# Patient Record
Sex: Female | Born: 1955 | Race: White | Hispanic: No
Health system: Southern US, Community
[De-identification: ages and names within clinical notes are randomized; demographics above are authoritative.]

---

## 2017-02-21 ENCOUNTER — Inpatient Hospital Stay
Admission: AD | Admit: 2017-02-21 | Discharge: 2017-03-31 | Disposition: A | Discharge status: Skilled Nursing Facility | Payer: Self-pay | Source: Other Acute Inpatient Hospital | Attending: Nurse Practitioner | Admitting: Nurse Practitioner

## 2017-02-21 DIAGNOSIS — R509 Fever, unspecified: Secondary | ICD-10-CM

## 2017-02-21 DIAGNOSIS — Z4901 Encounter for fitting and adjustment of extracorporeal dialysis catheter: Secondary | ICD-10-CM

## 2017-02-21 DIAGNOSIS — T17908A Unspecified foreign body in respiratory tract, part unspecified causing other injury, initial encounter: Secondary | ICD-10-CM

## 2017-02-21 DIAGNOSIS — Z4659 Encounter for fitting and adjustment of other gastrointestinal appliance and device: Secondary | ICD-10-CM

## 2017-02-21 DIAGNOSIS — T85598A Other mechanical complication of other gastrointestinal prosthetic devices, implants and grafts, initial encounter: Secondary | ICD-10-CM

## 2017-02-21 DIAGNOSIS — Z992 Dependence on renal dialysis: Secondary | ICD-10-CM

## 2017-02-21 DIAGNOSIS — J9690 Respiratory failure, unspecified, unspecified whether with hypoxia or hypercapnia: Secondary | ICD-10-CM

## 2017-02-21 DIAGNOSIS — Z452 Encounter for adjustment and management of vascular access device: Secondary | ICD-10-CM

## 2017-02-22 ENCOUNTER — Other Ambulatory Visit (HOSPITAL_COMMUNITY): Payer: Self-pay

## 2017-02-22 LAB — URINALYSIS, ROUTINE W REFLEX MICROSCOPIC
Bilirubin Urine: NEGATIVE
Glucose, UA: NEGATIVE mg/dL
Hgb urine dipstick: NEGATIVE
Ketones, ur: NEGATIVE mg/dL
Leukocytes, UA: NEGATIVE
NITRITE: NEGATIVE
PH: 6 (ref 5.0–8.0)
Protein, ur: NEGATIVE mg/dL
SPECIFIC GRAVITY, URINE: 1.013 (ref 1.005–1.030)

## 2017-02-22 LAB — CBC WITH DIFFERENTIAL/PLATELET
BASOS ABS: 0 10*3/uL (ref 0.0–0.1)
Basophils Relative: 0 %
EOS PCT: 4 %
Eosinophils Absolute: 0.5 10*3/uL (ref 0.0–0.7)
HEMATOCRIT: 30.9 % — AB (ref 36.0–46.0)
Hemoglobin: 9.2 g/dL — ABNORMAL LOW (ref 12.0–15.0)
LYMPHS ABS: 3.3 10*3/uL (ref 0.7–4.0)
Lymphocytes Relative: 25 %
MCH: 26.7 pg (ref 26.0–34.0)
MCHC: 29.8 g/dL — ABNORMAL LOW (ref 30.0–36.0)
MCV: 89.8 fL (ref 78.0–100.0)
MONO ABS: 1.2 10*3/uL — AB (ref 0.1–1.0)
Monocytes Relative: 9 %
NEUTROS ABS: 8.1 10*3/uL — AB (ref 1.7–7.7)
Neutrophils Relative %: 62 %
PLATELETS: 357 10*3/uL (ref 150–400)
RBC: 3.44 MIL/uL — AB (ref 3.87–5.11)
RDW: 16.2 % — ABNORMAL HIGH (ref 11.5–15.5)
WBC: 13.2 10*3/uL — AB (ref 4.0–10.5)

## 2017-02-22 LAB — COMPREHENSIVE METABOLIC PANEL
ALBUMIN: 2.2 g/dL — AB (ref 3.5–5.0)
ALT: 15 U/L (ref 14–54)
ANION GAP: 13 (ref 5–15)
AST: 19 U/L (ref 15–41)
Alkaline Phosphatase: 107 U/L (ref 38–126)
BUN: 20 mg/dL (ref 6–20)
CHLORIDE: 96 mmol/L — AB (ref 101–111)
CO2: 29 mmol/L (ref 22–32)
Calcium: 8.6 mg/dL — ABNORMAL LOW (ref 8.9–10.3)
Creatinine, Ser: 1.14 mg/dL — ABNORMAL HIGH (ref 0.44–1.00)
GFR calc Af Amer: 59 mL/min — ABNORMAL LOW (ref >60)
GFR calc non Af Amer: 51 mL/min — ABNORMAL LOW (ref >60)
GLUCOSE: 98 mg/dL (ref 65–99)
POTASSIUM: 4.4 mmol/L (ref 3.5–5.1)
Sodium: 138 mmol/L (ref 135–145)
TOTAL PROTEIN: 6.2 g/dL — AB (ref 6.5–8.1)
Total Bilirubin: 0.9 mg/dL (ref 0.3–1.2)

## 2017-02-22 LAB — C DIFFICILE QUICK SCREEN W PCR REFLEX
C DIFFICILE (CDIFF) INTERP: NOT DETECTED
C DIFFICILE (CDIFF) TOXIN: NEGATIVE
C DIFFICLE (CDIFF) ANTIGEN: NEGATIVE

## 2017-02-22 LAB — PROTIME-INR
INR: 1.27
Prothrombin Time: 15.8 seconds — ABNORMAL HIGH (ref 11.4–15.2)

## 2017-02-22 LAB — TSH: TSH: 0.055 u[IU]/mL — AB (ref 0.350–4.500)

## 2017-02-23 LAB — EXPECTORATED SPUTUM ASSESSMENT W GRAM STAIN, RFLX TO RESP C

## 2017-02-23 LAB — URINE CULTURE: CULTURE: NO GROWTH

## 2017-02-23 LAB — BLOOD CULTURE ID PANEL (REFLEXED)
Acinetobacter baumannii: NOT DETECTED
CANDIDA PARAPSILOSIS: NOT DETECTED
CANDIDA TROPICALIS: NOT DETECTED
Candida albicans: NOT DETECTED
Candida glabrata: NOT DETECTED
Candida krusei: NOT DETECTED
Enterobacter cloacae complex: NOT DETECTED
Enterobacteriaceae species: NOT DETECTED
Enterococcus species: NOT DETECTED
Escherichia coli: NOT DETECTED
HAEMOPHILUS INFLUENZAE: NOT DETECTED
KLEBSIELLA PNEUMONIAE: NOT DETECTED
Klebsiella oxytoca: NOT DETECTED
Listeria monocytogenes: NOT DETECTED
Methicillin resistance: DETECTED — AB
NEISSERIA MENINGITIDIS: NOT DETECTED
Proteus species: NOT DETECTED
Pseudomonas aeruginosa: NOT DETECTED
STREPTOCOCCUS SPECIES: NOT DETECTED
Serratia marcescens: NOT DETECTED
Staphylococcus aureus (BCID): NOT DETECTED
Staphylococcus species: DETECTED — AB
Streptococcus agalactiae: NOT DETECTED
Streptococcus pneumoniae: NOT DETECTED
Streptococcus pyogenes: NOT DETECTED

## 2017-02-23 LAB — T4, FREE: Free T4: 0.99 ng/dL (ref 0.61–1.12)

## 2017-02-25 LAB — BASIC METABOLIC PANEL
Anion gap: 11 (ref 5–15)
BUN: 14 mg/dL (ref 6–20)
CALCIUM: 8.6 mg/dL — AB (ref 8.9–10.3)
CO2: 32 mmol/L (ref 22–32)
CREATININE: 1.05 mg/dL — AB (ref 0.44–1.00)
Chloride: 96 mmol/L — ABNORMAL LOW (ref 101–111)
GFR calc non Af Amer: 57 mL/min — ABNORMAL LOW (ref >60)
Glucose, Bld: 94 mg/dL (ref 65–99)
Potassium: 3.6 mmol/L (ref 3.5–5.1)
Sodium: 139 mmol/L (ref 135–145)

## 2017-02-25 LAB — CBC
HCT: 28.2 % — ABNORMAL LOW (ref 36.0–46.0)
Hemoglobin: 8.6 g/dL — ABNORMAL LOW (ref 12.0–15.0)
MCH: 27.1 pg (ref 26.0–34.0)
MCHC: 30.5 g/dL (ref 30.0–36.0)
MCV: 89 fL (ref 78.0–100.0)
PLATELETS: 367 10*3/uL (ref 150–400)
RBC: 3.17 MIL/uL — ABNORMAL LOW (ref 3.87–5.11)
RDW: 16 % — AB (ref 11.5–15.5)
WBC: 10.4 10*3/uL (ref 4.0–10.5)

## 2017-02-25 LAB — CULTURE, BLOOD (ROUTINE X 2): Special Requests: ADEQUATE

## 2017-02-25 LAB — CULTURE, RESPIRATORY: CULTURE: NORMAL

## 2017-02-25 LAB — MAGNESIUM: Magnesium: 2 mg/dL (ref 1.7–2.4)

## 2017-02-25 LAB — PHOSPHORUS: Phosphorus: 4.6 mg/dL (ref 2.5–4.6)

## 2017-02-25 LAB — CULTURE, RESPIRATORY W GRAM STAIN

## 2017-02-26 MED ORDER — SODIUM CHLORIDE 0.9 % IV SOLN
INTRAVENOUS | Status: DC
Start: ? — End: 2017-02-26

## 2017-02-26 MED ORDER — ONDANSETRON HCL 4 MG/2ML IJ SOLN
4.00 | INTRAMUSCULAR | Status: DC
Start: ? — End: 2017-02-26

## 2017-02-26 MED ORDER — GENERIC EXTERNAL MEDICATION
Status: DC
Start: ? — End: 2017-02-26

## 2017-02-26 MED ORDER — POLYETHYLENE GLYCOL 3350 17 G PO PACK
17.00 | PACK | ORAL | Status: DC
Start: ? — End: 2017-02-26

## 2017-02-26 MED ORDER — HYDRALAZINE HCL 20 MG/ML IJ SOLN
10.00 | INTRAMUSCULAR | Status: DC
Start: ? — End: 2017-02-26

## 2017-02-26 MED ORDER — PANTOPRAZOLE SODIUM 40 MG PO TBEC
40.00 | DELAYED_RELEASE_TABLET | ORAL | Status: DC
Start: 2017-02-22 — End: 2017-02-26

## 2017-02-26 MED ORDER — POTASSIUM CHLORIDE ER 10 MEQ PO TBCR
EXTENDED_RELEASE_TABLET | ORAL | Status: DC
Start: 2017-02-22 — End: 2017-02-26

## 2017-02-26 MED ORDER — MORPHINE SULFATE (PF) 4 MG/ML IV SOLN
2.00 | INTRAVENOUS | Status: DC
Start: ? — End: 2017-02-26

## 2017-02-26 MED ORDER — SENNA-DOCUSATE SODIUM 8.6-50 MG PO TABS
ORAL_TABLET | ORAL | Status: DC
Start: 2017-02-21 — End: 2017-02-26

## 2017-02-26 MED ORDER — ASPIRIN 325 MG PO TABS
325.00 | ORAL_TABLET | ORAL | Status: DC
Start: 2017-02-22 — End: 2017-02-26

## 2017-02-26 MED ORDER — GABAPENTIN 300 MG PO CAPS
300.00 | ORAL_CAPSULE | ORAL | Status: DC
Start: 2017-02-21 — End: 2017-02-26

## 2017-02-26 MED ORDER — ONDANSETRON HCL 4 MG PO TABS
4.00 | ORAL_TABLET | ORAL | Status: DC
Start: ? — End: 2017-02-26

## 2017-02-26 MED ORDER — GENERIC EXTERNAL MEDICATION
9.20 | Status: DC
Start: 2017-02-21 — End: 2017-02-26

## 2017-02-26 MED ORDER — LISINOPRIL 10 MG PO TABS
10.00 | ORAL_TABLET | ORAL | Status: DC
Start: 2017-02-22 — End: 2017-02-26

## 2017-02-26 MED ORDER — OXYCODONE HCL 5 MG PO TABS
5.00 | ORAL_TABLET | ORAL | Status: DC
Start: ? — End: 2017-02-26

## 2017-02-26 MED ORDER — BISACODYL 10 MG RE SUPP
10.00 | RECTAL | Status: DC
Start: ? — End: 2017-02-26

## 2017-02-26 MED ORDER — FUROSEMIDE 40 MG PO TABS
40.00 | ORAL_TABLET | ORAL | Status: DC
Start: 2017-02-21 — End: 2017-02-26

## 2017-02-27 LAB — BASIC METABOLIC PANEL
Anion gap: 12 (ref 5–15)
BUN: 16 mg/dL (ref 6–20)
CO2: 29 mmol/L (ref 22–32)
Calcium: 8.6 mg/dL — ABNORMAL LOW (ref 8.9–10.3)
Chloride: 97 mmol/L — ABNORMAL LOW (ref 101–111)
Creatinine, Ser: 1.02 mg/dL — ABNORMAL HIGH (ref 0.44–1.00)
GFR calc Af Amer: >60 mL/min (ref >60)
GFR calc non Af Amer: 59 mL/min — ABNORMAL LOW (ref >60)
Glucose, Bld: 98 mg/dL (ref 65–99)
Potassium: 3.8 mmol/L (ref 3.5–5.1)
Sodium: 138 mmol/L (ref 135–145)

## 2017-02-27 LAB — MAGNESIUM: MAGNESIUM: 1.9 mg/dL (ref 1.7–2.4)

## 2017-02-27 LAB — CK: Total CK: 197 U/L (ref 38–234)

## 2017-02-27 LAB — CBC
HCT: 28.7 % — ABNORMAL LOW (ref 36.0–46.0)
Hemoglobin: 8.6 g/dL — ABNORMAL LOW (ref 12.0–15.0)
MCH: 26.5 pg (ref 26.0–34.0)
MCHC: 30 g/dL (ref 30.0–36.0)
MCV: 88.6 fL (ref 78.0–100.0)
PLATELETS: 386 10*3/uL (ref 150–400)
RBC: 3.24 MIL/uL — ABNORMAL LOW (ref 3.87–5.11)
RDW: 16 % — AB (ref 11.5–15.5)
WBC: 9.9 10*3/uL (ref 4.0–10.5)

## 2017-02-27 LAB — CULTURE, BLOOD (ROUTINE X 2)
CULTURE: NO GROWTH
Special Requests: ADEQUATE

## 2017-02-27 LAB — PHOSPHORUS: Phosphorus: 3.7 mg/dL (ref 2.5–4.6)

## 2017-03-02 LAB — URINALYSIS, ROUTINE W REFLEX MICROSCOPIC
BILIRUBIN URINE: NEGATIVE
GLUCOSE, UA: NEGATIVE mg/dL
HGB URINE DIPSTICK: NEGATIVE
Ketones, ur: NEGATIVE mg/dL
LEUKOCYTES UA: NEGATIVE
Nitrite: NEGATIVE
PH: 5 (ref 5.0–8.0)
Protein, ur: NEGATIVE mg/dL
Specific Gravity, Urine: 1.009 (ref 1.005–1.030)

## 2017-03-03 LAB — BASIC METABOLIC PANEL
ANION GAP: 9 (ref 5–15)
BUN: 19 mg/dL (ref 6–20)
CHLORIDE: 97 mmol/L — AB (ref 101–111)
CO2: 32 mmol/L (ref 22–32)
Calcium: 8.7 mg/dL — ABNORMAL LOW (ref 8.9–10.3)
Creatinine, Ser: 0.99 mg/dL (ref 0.44–1.00)
GFR calc Af Amer: >60 mL/min (ref >60)
GFR calc non Af Amer: >60 mL/min (ref >60)
GLUCOSE: 107 mg/dL — AB (ref 65–99)
POTASSIUM: 3.8 mmol/L (ref 3.5–5.1)
SODIUM: 138 mmol/L (ref 135–145)

## 2017-03-03 LAB — PHOSPHORUS: Phosphorus: 4.4 mg/dL (ref 2.5–4.6)

## 2017-03-03 LAB — CBC
HCT: 30.2 % — ABNORMAL LOW (ref 36.0–46.0)
HEMOGLOBIN: 9 g/dL — AB (ref 12.0–15.0)
MCH: 26.5 pg (ref 26.0–34.0)
MCHC: 29.8 g/dL — ABNORMAL LOW (ref 30.0–36.0)
MCV: 88.8 fL (ref 78.0–100.0)
Platelets: 431 10*3/uL — ABNORMAL HIGH (ref 150–400)
RBC: 3.4 MIL/uL — ABNORMAL LOW (ref 3.87–5.11)
RDW: 16.1 % — ABNORMAL HIGH (ref 11.5–15.5)
WBC: 11.9 10*3/uL — AB (ref 4.0–10.5)

## 2017-03-03 LAB — MAGNESIUM: MAGNESIUM: 1.8 mg/dL (ref 1.7–2.4)

## 2017-03-05 ENCOUNTER — Other Ambulatory Visit (HOSPITAL_COMMUNITY): Payer: Self-pay

## 2017-03-05 LAB — BASIC METABOLIC PANEL
ANION GAP: 12 (ref 5–15)
BUN: 29 mg/dL — ABNORMAL HIGH (ref 6–20)
CO2: 25 mmol/L (ref 22–32)
Calcium: 8.6 mg/dL — ABNORMAL LOW (ref 8.9–10.3)
Chloride: 102 mmol/L (ref 101–111)
Creatinine, Ser: 1.39 mg/dL — ABNORMAL HIGH (ref 0.44–1.00)
GFR calc Af Amer: 47 mL/min — ABNORMAL LOW (ref >60)
GFR, EST NON AFRICAN AMERICAN: 40 mL/min — AB (ref >60)
Glucose, Bld: 98 mg/dL (ref 65–99)
POTASSIUM: 4.9 mmol/L (ref 3.5–5.1)
Sodium: 139 mmol/L (ref 135–145)

## 2017-03-05 LAB — CBC
HEMATOCRIT: 29.9 % — AB (ref 36.0–46.0)
HEMOGLOBIN: 8.7 g/dL — AB (ref 12.0–15.0)
MCH: 25.9 pg — ABNORMAL LOW (ref 26.0–34.0)
MCHC: 29.1 g/dL — ABNORMAL LOW (ref 30.0–36.0)
MCV: 89 fL (ref 78.0–100.0)
Platelets: 437 10*3/uL — ABNORMAL HIGH (ref 150–400)
RBC: 3.36 MIL/uL — ABNORMAL LOW (ref 3.87–5.11)
RDW: 15.7 % — ABNORMAL HIGH (ref 11.5–15.5)
WBC: 11.9 10*3/uL — AB (ref 4.0–10.5)

## 2017-03-05 MED ORDER — GENERIC EXTERNAL MEDICATION
Status: DC
Start: ? — End: 2017-03-05

## 2017-03-06 LAB — LACTIC ACID, PLASMA: Lactic Acid, Venous: 1.2 mmol/L (ref 0.5–1.9)

## 2017-03-07 ENCOUNTER — Other Ambulatory Visit (HOSPITAL_COMMUNITY): Payer: Self-pay

## 2017-03-07 ENCOUNTER — Encounter (HOSPITAL_COMMUNITY): Payer: Self-pay | Admitting: Interventional Radiology

## 2017-03-07 HISTORY — PX: IR US GUIDE VASC ACCESS RIGHT: IMG2390

## 2017-03-07 HISTORY — PX: IR FLUORO GUIDE CV LINE RIGHT: IMG2283

## 2017-03-07 LAB — BASIC METABOLIC PANEL
Anion gap: 10 (ref 5–15)
BUN: 59 mg/dL — AB (ref 6–20)
CALCIUM: 7.9 mg/dL — AB (ref 8.9–10.3)
CO2: 26 mmol/L (ref 22–32)
CREATININE: 4.54 mg/dL — AB (ref 0.44–1.00)
Chloride: 98 mmol/L — ABNORMAL LOW (ref 101–111)
GFR calc Af Amer: 11 mL/min — ABNORMAL LOW (ref >60)
GFR calc non Af Amer: 10 mL/min — ABNORMAL LOW (ref >60)
GLUCOSE: 102 mg/dL — AB (ref 65–99)
Potassium: 5.9 mmol/L — ABNORMAL HIGH (ref 3.5–5.1)
Sodium: 134 mmol/L — ABNORMAL LOW (ref 135–145)

## 2017-03-07 LAB — BLOOD GAS, ARTERIAL
Acid-Base Excess: 1.8 mmol/L (ref 0.0–2.0)
Bicarbonate: 28.2 mmol/L — ABNORMAL HIGH (ref 20.0–28.0)
O2 CONTENT: 4 L/min
O2 Saturation: 98 %
PCO2 ART: 65.2 mmHg — AB (ref 32.0–48.0)
PO2 ART: 107 mmHg (ref 83.0–108.0)
Patient temperature: 98.6
pH, Arterial: 7.259 — ABNORMAL LOW (ref 7.350–7.450)

## 2017-03-07 LAB — LACTIC ACID, PLASMA
LACTIC ACID, VENOUS: 1.3 mmol/L (ref 0.5–1.9)
Lactic Acid, Venous: 1 mmol/L (ref 0.5–1.9)
Lactic Acid, Venous: 1.2 mmol/L (ref 0.5–1.9)

## 2017-03-07 LAB — CBC
HCT: 26.1 % — ABNORMAL LOW (ref 36.0–46.0)
Hemoglobin: 7.7 g/dL — ABNORMAL LOW (ref 12.0–15.0)
MCH: 26.3 pg (ref 26.0–34.0)
MCHC: 29.5 g/dL — AB (ref 30.0–36.0)
MCV: 89.1 fL (ref 78.0–100.0)
Platelets: 381 10*3/uL (ref 150–400)
RBC: 2.93 MIL/uL — ABNORMAL LOW (ref 3.87–5.11)
RDW: 16.4 % — AB (ref 11.5–15.5)
WBC: 12.1 10*3/uL — ABNORMAL HIGH (ref 4.0–10.5)

## 2017-03-07 LAB — URINE CULTURE
Culture: 40000 — AB
Special Requests: NORMAL

## 2017-03-07 LAB — AMMONIA: Ammonia: 36 umol/L — ABNORMAL HIGH (ref 9–35)

## 2017-03-07 MED ORDER — HEPARIN SODIUM (PORCINE) 1000 UNIT/ML IJ SOLN
INTRAMUSCULAR | Status: AC
  Filled 2017-03-07: qty 1

## 2017-03-07 MED ORDER — LIDOCAINE HCL 1 % IJ SOLN
INTRAMUSCULAR | Status: DC | PRN
  Administered 2017-03-07: 10 mL

## 2017-03-07 MED ORDER — LIDOCAINE HCL 1 % IJ SOLN
INTRAMUSCULAR | Status: AC
  Filled 2017-03-07: qty 20

## 2017-03-07 MED ORDER — GENERIC EXTERNAL MEDICATION
Status: DC
Start: ? — End: 2017-03-07

## 2017-03-07 NOTE — Consult Note (Signed)
[facility] CONSULT NOTE    Date: 03/07/2017                  Patient Name:  Janet Perkins  MRN: [id number]  DOB: 1955-02-19  Age / Sex: 62 y.o., female         PCP: Patient, No Pcp Per                 Service Requesting Consult: Hospitalist                 Reason for Consult: Acute renal failure/hyperkalemia.            History of Present Illness: Patient is a 62 y.o. female with a PMHx of subclinical hyperthyroidism, chronic diarrhea, lymphedema, history of respiratory failure, morbid obesity, who was admitted to [facility] on 02/21/2017 for ongoing treatment of left hip infection and generalized debility.  She sustained a left hip fracture on January 18, 2017 and had ORIF.  During surgery she unfortunately suffered a cardiac arrest and was sent to the ICU.  She had a second surgery on January 22, 2017.  Left hip failed fixation healed and patient had to have hardware removal and I&D of the left hip wound with wound VAC placement.  She was subsequent started on daptomycin and Zosyn.  We are now to see the patient for acute renal failure.  2 days ago her BUN was 29 with a creatinine 1.39.  Today her BUN has risen to 59 with a creatinine of 4.54.  In addition we note hyperkalemia with a serum potassium of 5.9.  Patient unable to offer any history.  Patient has also become oliguric at this time.  Case discussed in depth with hospitalist and we will proceed with a course of renal replacement therapy.  Radiology has been consulted to place temporary dialysis catheter.   Medications: Cefepime 2 g IV daily, aspirin 81 mg daily, daptomycin 1000 mg IV daily, Colace 10 mg nightly, Pepcid 20 mg twice daily, furosemide 40 mg twice daily which has been stopped, gabapentin 300 mg nightly, lisinopril 10 mg daily which has been stopped now, OxyContin 10 mg 3 times daily, potassium chloride 20 mEq daily, pro-stat 30 cc twice daily, senna 2 tablets twice daily.  Allergies: Allergies not on  file    Past Medical History: Morbid obesity Obstructive sleep apnea Hypertension Lymphedema Recent left hip fracture status post ORIF with subsequent hardware removal Acute respiratory failure   Past Surgical History: Left hip fracture status post ORIF with subsequent hardware removal  Family History: Unable to obtain from the patient currently as she has altered mental status.  Social History: Unable to obtain from the patient as she currently has altered mental status.  Review of Systems: Unable to obtain from the patient as she currently has altered mental status.  Vital Signs: Temperature 100.0 pulse 87 respirations 34 blood pressure 85/66  Weight trends: There were no vitals filed for this visit.  Physical Exam: General: Critically illa ppearing  Head: Normocephalic, atraumatic.  Eyes: Anicteric, EOMI  Nose: Mucous membranes dry, not inflammed, nonerythematous.  Throat: Oropharynx nonerythematous, no exudate appreciated. OM druy  Neck: Supple, trachea midline.  Lungs:  Normal respiratory effort. Clear to auscultation BL without crackles or wheezes.  Heart: RRR. S1 and S2  No rubs  Abdomen:  BS normoactive. Soft, Nondistended, non-tender.  No masses or organomegaly.  Extremities: Contracted skin b/l LE's  Neurologic: Difficult to arouse, not following commands  Skin: Skin contracted  b/l LE's    Lab results: Basic Metabolic Panel: Recent Labs  Lab 03/03/17 0850 03/05/17 0548 03/07/17 0556  NA 138 139 134*  K 3.8 4.9 5.9*  CL 97* 102 98*  CO2 32 25 26  GLUCOSE 107* 98 102*  BUN 19 29* 59*  CREATININE 0.99 1.39* 4.54*  CALCIUM 8.7* 8.6* 7.9*  MG 1.8  --   --   PHOS 4.4  --   --     Liver Function Tests: No results for input(s): AST, ALT, ALKPHOS, BILITOT, PROT, ALBUMIN in the last 168 hours. No results for input(s): LIPASE, AMYLASE in the last 168 hours. No results for input(s): AMMONIA in the last 168 hours.  CBC: Recent Labs  Lab  03/03/17 0850 03/05/17 0841 03/07/17 0556  WBC 11.9* 11.9* 12.1*  HGB 9.0* 8.7* 7.7*  HCT 30.2* 29.9* 26.1*  MCV 88.8 89.0 89.1  PLT 431* 437* 381    Cardiac Enzymes: No results for input(s): CKTOTAL, CKMB, CKMBINDEX, TROPONINI in the last 168 hours.  BNP: Invalid input(s): POCBNP  CBG: No results for input(s): GLUCAP in the last 168 hours.  Microbiology: Results for orders placed or performed during the hospital encounter of 02/21/17  Culture, blood (routine x 2)     Status: Abnormal   Collection Time: 02/22/17 10:05 AM  Result Value Ref Range Status   Specimen Description BLOOD RIGHT HAND  Final   Special Requests IN PEDIATRIC BOTTLE Blood Culture adequate volume  Final   Culture  Setup Time   Final    GRAM POSITIVE COCCI IN CLUSTERS IN PEDIATRIC BOTTLE CRITICAL RESULT CALLED TO, READ BACK BY AND VERIFIED WITH: PERRY RN AT 1430 ON [id number] BY [facility]    Culture (A)  Final    STAPHYLOCOCCUS SPECIES (COAGULASE NEGATIVE) THE SIGNIFICANCE OF ISOLATING THIS ORGANISM FROM A SINGLE SET OF BLOOD CULTURES WHEN MULTIPLE SETS ARE DRAWN IS UNCERTAIN. PLEASE NOTIFY THE MICROBIOLOGY DEPARTMENT WITHIN ONE WEEK IF SPECIATION AND SENSITIVITIES ARE REQUIRED. Performed at [facility], [address], [place], [place] [address]    Report Status 02/25/2017 FINAL  Final  Blood Culture ID Panel (Reflexed)     Status: Abnormal   Collection Time: 02/22/17 10:05 AM  Result Value Ref Range Status   Enterococcus species NOT DETECTED NOT DETECTED Final   Listeria monocytogenes NOT DETECTED NOT DETECTED Final   Staphylococcus species DETECTED (A) NOT DETECTED Final    Comment: Methicillin (oxacillin) resistant coagulase negative staphylococcus. Possible blood culture contaminant (unless isolated from more than one blood culture draw or clinical case suggests pathogenicity). No antibiotic treatment is indicated for blood  culture contaminants. CRITICAL RESULT CALLED TO, READ BACK BY  AND VERIFIED WITH: LAUREN BAJBUS PHRAMD AT 1416 ON [id number] BY [facility]    Staphylococcus aureus NOT DETECTED NOT DETECTED Final   Methicillin resistance DETECTED (A) NOT DETECTED Final    Comment: CRITICAL RESULT CALLED TO, READ BACK BY AND VERIFIED WITH: LAUREN BAJBUS PHARMD AT 1618 ON [id number] BY [facility]    Streptococcus species NOT DETECTED NOT DETECTED Final   Streptococcus agalactiae NOT DETECTED NOT DETECTED Final   Streptococcus pneumoniae NOT DETECTED NOT DETECTED Final   Streptococcus pyogenes NOT DETECTED NOT DETECTED Final   Acinetobacter baumannii NOT DETECTED NOT DETECTED Final   Enterobacteriaceae species NOT DETECTED NOT DETECTED Final   Enterobacter cloacae complex NOT DETECTED NOT DETECTED Final   Escherichia coli NOT DETECTED NOT DETECTED Final   Klebsiella oxytoca NOT DETECTED NOT DETECTED Final  Klebsiella pneumoniae NOT DETECTED NOT DETECTED Final   Proteus species NOT DETECTED NOT DETECTED Final   Serratia marcescens NOT DETECTED NOT DETECTED Final   Haemophilus influenzae NOT DETECTED NOT DETECTED Final   Neisseria meningitidis NOT DETECTED NOT DETECTED Final   Pseudomonas aeruginosa NOT DETECTED NOT DETECTED Final   Candida albicans NOT DETECTED NOT DETECTED Final   Candida glabrata NOT DETECTED NOT DETECTED Final   Candida krusei NOT DETECTED NOT DETECTED Final   Candida parapsilosis NOT DETECTED NOT DETECTED Final   Candida tropicalis NOT DETECTED NOT DETECTED Final    Comment: Performed at [facility], [address], [place], [place] [address]  Culture, blood (routine x 2)     Status: None   Collection Time: 02/22/17 10:12 AM  Result Value Ref Range Status   Specimen Description BLOOD RIGHT ARM  Final   Special Requests IN PEDIATRIC BOTTLE Blood Culture adequate volume  Final   Culture   Final    NO GROWTH 5 DAYS Performed at [facility], [address] [place], [place] [address]    Report Status 02/27/2017 FINAL  Final  C difficile quick scan w  PCR reflex     Status: None   Collection Time: 02/22/17  2:00 PM  Result Value Ref Range Status   C Diff antigen NEGATIVE NEGATIVE Final   C Diff toxin NEGATIVE NEGATIVE Final   C Diff interpretation No C. difficile detected.  Final    Comment: Performed at [facility], [address] [place], [place] [address]  Culture, Urine     Status: None   Collection Time: 02/22/17  5:44 PM  Result Value Ref Range Status   Specimen Description URINE, RANDOM  Final   Special Requests NONE  Final   Culture   Final    NO GROWTH Performed at [facility], [address] [place], [place] [address]    Report Status 02/23/2017 FINAL  Final  Culture, expectorated sputum-assessment     Status: None   Collection Time: 02/22/17  8:33 PM  Result Value Ref Range Status   Specimen Description SPUTUM  Final   Special Requests NONE  Final   Sputum evaluation   Final    THIS SPECIMEN IS ACCEPTABLE FOR SPUTUM CULTURE Performed at [facility], [address], [place], [place] [address]    Report Status 02/23/2017 FINAL  Final  Culture, respiratory (NON-Expectorated)     Status: None   Collection Time: 02/22/17  8:33 PM  Result Value Ref Range Status   Specimen Description SPUTUM  Final   Special Requests NONE Reflexed from [id number]  Final   Gram Stain   Final    FEW WBC PRESENT, PREDOMINANTLY PMN RARE YEAST FEW GRAM POSITIVE COCCI IN CHAINS FEW SQUAMOUS EPITHELIAL CELLS PRESENT    Culture   Final    Consistent with normal respiratory flora. Performed at [facility], [address] [place], [place] [address]    Report Status 02/25/2017 FINAL  Final  Culture, Urine     Status: Abnormal (Preliminary result)   Collection Time: 03/05/17  3:23 AM  Result Value Ref Range Status   Specimen Description URINE, RANDOM  Final   Special Requests Normal  Final   Culture (A)  Final    40,000 COLONIES/mL PSEUDOMONAS AERUGINOSA SUSCEPTIBILITIES TO FOLLOW Performed at [facility], [address], [place], [place] [address]    Report Status PENDING  Incomplete    Coagulation Studies: No results for input(s): LABPROT, INR in the last 72 hours.  Urinalysis: No results for input(s): COLORURINE, LABSPEC, PHURINE, GLUCOSEU, HGBUR, BILIRUBINUR, KETONESUR, PROTEINUR, UROBILINOGEN, NITRITE, LEUKOCYTESUR in the last 72 hours.  Invalid input(s): APPERANCEUR    Imaging: Dg Chest Port 1 View  Result Date: 03/05/2017 CLINICAL DATA:  Fever. EXAM: PORTABLE CHEST 1 VIEW COMPARISON:  02/22/2017 FINDINGS: The heart size and mediastinal contours are within normal limits. Both lungs are clear. The visualized skeletal structures are unremarkable. IMPRESSION: Normal exam. Electronically Signed   By: Francene BoyersJames  Maxwell M.D.   On: 03/05/2017 17:37      Assessment & Plan: Pt is a 62 y.o. female with a PMHx of subclinical hyperthyroidism, chronic diarrhea, lymphedema, history of respiratory failure, morbid obesity, who was admitted to [facility] on 02/21/2017 for ongoing treatment of left hip infection and generalized debility. She sustained a left hip fracture on January 18, 2017 and had ORIF.  During surgery she unfortunately suffered a cardiac arrest and was sent to the ICU.  She had a second surgery on January 22, 2017.  Left hip failed fixation healed and patient had to have hardware removal and I&D of the left hip wound with wound VAC placement.   1.  Acute renal failure. 2.  Hyperkalemia. 3.  Hypotension. 4.  Hyponatremia.  Plan: The patient has severe acute renal failure now with hyperkalemia.  If she is febrile now and also has hypotension.  She may have re-infection of her left hip.  Recommend further evaluation of this but defer management of this to hospitalist.  In regards to her acute renal failure we have discussed the case with the hospitalist and recommend a course of hemodialysis.  Therefore we will have radiology to place a temporary dialysis catheter.  We will plan for  dialysis today as well as tomorrow.  We also recommend initiation of pressors to maintain map of 65 or greater.  Overall prognosis appears to be quite guarded.

## 2017-03-07 NOTE — Procedures (Signed)
ARF  S/P rt ij temp hd cath  Tip svcra No comp Stable  EBL 0 Ready for use Full report in pacs

## 2017-03-07 NOTE — Progress Notes (Signed)
Patient ID: Janet Perkins, female   DOB: Apr 28, 1955, 62 y.o.   MRN: [id number]   Temporary dialysis catheter placement  Acute renal failure Rising creatinine Pt hx chronic pain and chronic opioids Left hip fx; replacement; infection Hypotension; UTI  Now needs dialysis  Temp cath request   Call into son for consent

## 2017-03-08 LAB — CBC
HEMATOCRIT: 26.2 % — AB (ref 36.0–46.0)
HEMOGLOBIN: 7.8 g/dL — AB (ref 12.0–15.0)
MCH: 25.7 pg — ABNORMAL LOW (ref 26.0–34.0)
MCHC: 29.8 g/dL — ABNORMAL LOW (ref 30.0–36.0)
MCV: 86.5 fL (ref 78.0–100.0)
Platelets: 370 10*3/uL (ref 150–400)
RBC: 3.03 MIL/uL — AB (ref 3.87–5.11)
RDW: 16 % — ABNORMAL HIGH (ref 11.5–15.5)
WBC: 12.2 10*3/uL — AB (ref 4.0–10.5)

## 2017-03-08 LAB — RENAL FUNCTION PANEL
Albumin: 1.8 g/dL — ABNORMAL LOW (ref 3.5–5.0)
Anion gap: 10 (ref 5–15)
BUN: 52 mg/dL — ABNORMAL HIGH (ref 6–20)
CHLORIDE: 101 mmol/L (ref 101–111)
CO2: 25 mmol/L (ref 22–32)
Calcium: 8.1 mg/dL — ABNORMAL LOW (ref 8.9–10.3)
Creatinine, Ser: 2.29 mg/dL — ABNORMAL HIGH (ref 0.44–1.00)
GFR calc non Af Amer: 22 mL/min — ABNORMAL LOW (ref >60)
GFR, EST AFRICAN AMERICAN: 26 mL/min — AB (ref >60)
Glucose, Bld: 92 mg/dL (ref 65–99)
POTASSIUM: 5.7 mmol/L — AB (ref 3.5–5.1)
Phosphorus: 4.3 mg/dL (ref 2.5–4.6)
Sodium: 136 mmol/L (ref 135–145)

## 2017-03-08 LAB — POTASSIUM: Potassium: 5.1 mmol/L (ref 3.5–5.1)

## 2017-03-08 LAB — HEPATITIS B SURFACE ANTIBODY,QUALITATIVE: Hep B S Ab: NONREACTIVE

## 2017-03-08 LAB — HEPATITIS B CORE ANTIBODY, TOTAL: HEP B C TOTAL AB: NEGATIVE

## 2017-03-08 LAB — HEPATITIS B SURFACE ANTIGEN: Hepatitis B Surface Ag: NEGATIVE

## 2017-03-08 LAB — MAGNESIUM: MAGNESIUM: 2.1 mg/dL (ref 1.7–2.4)

## 2017-03-09 ENCOUNTER — Other Ambulatory Visit (HOSPITAL_COMMUNITY): Payer: Self-pay

## 2017-03-09 LAB — BASIC METABOLIC PANEL
ANION GAP: 14 (ref 5–15)
BUN: 26 mg/dL — ABNORMAL HIGH (ref 6–20)
CALCIUM: 7.2 mg/dL — AB (ref 8.9–10.3)
CO2: 20 mmol/L — AB (ref 22–32)
Chloride: 114 mmol/L — ABNORMAL HIGH (ref 101–111)
Creatinine, Ser: 0.84 mg/dL (ref 0.44–1.00)
GLUCOSE: 86 mg/dL (ref 65–99)
POTASSIUM: 4.6 mmol/L (ref 3.5–5.1)
Sodium: 148 mmol/L — ABNORMAL HIGH (ref 135–145)

## 2017-03-09 LAB — CBC
HEMATOCRIT: 26 % — AB (ref 36.0–46.0)
Hemoglobin: 7.9 g/dL — ABNORMAL LOW (ref 12.0–15.0)
MCH: 26 pg (ref 26.0–34.0)
MCHC: 30.4 g/dL (ref 30.0–36.0)
MCV: 85.5 fL (ref 78.0–100.0)
Platelets: 377 10*3/uL (ref 150–400)
RBC: 3.04 MIL/uL — AB (ref 3.87–5.11)
RDW: 16.1 % — ABNORMAL HIGH (ref 11.5–15.5)
WBC: 6.7 10*3/uL (ref 4.0–10.5)

## 2017-03-09 LAB — AMMONIA: AMMONIA: 22 umol/L (ref 9–35)

## 2017-03-10 ENCOUNTER — Other Ambulatory Visit (HOSPITAL_COMMUNITY): Payer: Self-pay

## 2017-03-10 LAB — BASIC METABOLIC PANEL
Anion gap: 9 (ref 5–15)
BUN: 17 mg/dL (ref 6–20)
CHLORIDE: 107 mmol/L (ref 101–111)
CO2: 25 mmol/L (ref 22–32)
CREATININE: 0.87 mg/dL (ref 0.44–1.00)
Calcium: 8.9 mg/dL (ref 8.9–10.3)
GFR calc Af Amer: >60 mL/min (ref >60)
GFR calc non Af Amer: >60 mL/min (ref >60)
Glucose, Bld: 141 mg/dL — ABNORMAL HIGH (ref 65–99)
Potassium: 4.3 mmol/L (ref 3.5–5.1)
Sodium: 141 mmol/L (ref 135–145)

## 2017-03-10 LAB — EXPECTORATED SPUTUM ASSESSMENT W GRAM STAIN, RFLX TO RESP C

## 2017-03-10 LAB — RENAL FUNCTION PANEL
ALBUMIN: 2.3 g/dL — AB (ref 3.5–5.0)
ANION GAP: 10 (ref 5–15)
BUN: 17 mg/dL (ref 6–20)
CALCIUM: 8.8 mg/dL — AB (ref 8.9–10.3)
CO2: 25 mmol/L (ref 22–32)
Chloride: 107 mmol/L (ref 101–111)
Creatinine, Ser: 0.85 mg/dL (ref 0.44–1.00)
GFR calc non Af Amer: >60 mL/min (ref >60)
Glucose, Bld: 127 mg/dL — ABNORMAL HIGH (ref 65–99)
PHOSPHORUS: 2.9 mg/dL (ref 2.5–4.6)
POTASSIUM: 4.4 mmol/L (ref 3.5–5.1)
SODIUM: 142 mmol/L (ref 135–145)

## 2017-03-10 LAB — CBC
HEMATOCRIT: 28.2 % — AB (ref 36.0–46.0)
HEMOGLOBIN: 8.4 g/dL — AB (ref 12.0–15.0)
MCH: 25.7 pg — ABNORMAL LOW (ref 26.0–34.0)
MCHC: 29.8 g/dL — ABNORMAL LOW (ref 30.0–36.0)
MCV: 86.2 fL (ref 78.0–100.0)
Platelets: 459 10*3/uL — ABNORMAL HIGH (ref 150–400)
RBC: 3.27 MIL/uL — AB (ref 3.87–5.11)
RDW: 16.1 % — ABNORMAL HIGH (ref 11.5–15.5)
WBC: 15.8 10*3/uL — ABNORMAL HIGH (ref 4.0–10.5)

## 2017-03-10 LAB — BLOOD GAS, ARTERIAL
ACID-BASE EXCESS: 3.5 mmol/L — AB (ref 0.0–2.0)
BICARBONATE: 28.4 mmol/L — AB (ref 20.0–28.0)
O2 Content: 4 L/min
O2 Saturation: 98.8 %
PCO2 ART: 49.9 mmHg — AB (ref 32.0–48.0)
PH ART: 7.373 (ref 7.350–7.450)
PO2 ART: 127 mmHg — AB (ref 83.0–108.0)
Patient temperature: 98.6

## 2017-03-10 LAB — LACTIC ACID, PLASMA
LACTIC ACID, VENOUS: 1.1 mmol/L (ref 0.5–1.9)
LACTIC ACID, VENOUS: 1.1 mmol/L (ref 0.5–1.9)

## 2017-03-10 LAB — MAGNESIUM: Magnesium: 1.7 mg/dL (ref 1.7–2.4)

## 2017-03-10 NOTE — Progress Notes (Signed)
[facility]  ROUNDING NOTE   Subjective:  Renal function has significantly improved.  Patient did have dialysis on Friday and then on Sunday. She may have aspirated earlier today. Temperature up to 100.8.   Objective:  Vital signs in last 24 hours:  Temperature 100.8 pulse 105 respirations 42 blood pressure 133/80  Physical Exam: General: Critically ill appearing  Head: Normocephalic, atraumatic. Moist oral mucosal membranes  Eyes: Anicteric  Neck: Supple, trachea midline  Lungs:  Bilateral rales and rhonchi, tachypneic  Heart: S1S2 no rubs  Abdomen:  Soft, nontender, bowel sounds present  Extremities: 1+ peripheral edema.  Neurologic: Awake, alert, not consistently following commands  Skin: No lesions  Access: R IJ temporary dialysis catheter.    Basic Metabolic Panel: Recent Labs  Lab 03/07/17 0556 03/08/17 0616 03/08/17 1618 03/09/17 0955 03/10/17 1029 03/10/17 1141  NA 134* 136  --  148* 141 142  K 5.9* 5.7* 5.1 4.6 4.3 4.4  CL 98* 101  --  114* 107 107  CO2 26 25  --  20* 25 25  GLUCOSE 102* 92  --  86 141* 127*  BUN 59* 52*  --  26* 17 17  CREATININE 4.54* 2.29*  --  0.84 0.87 0.85  CALCIUM 7.9* 8.1*  --  7.2* 8.9 8.8*  MG  --  2.1  --   --   --  1.7  PHOS  --  4.3  --   --   --  2.9    Liver Function Tests: Recent Labs  Lab 03/08/17 0616 03/10/17 1141  ALBUMIN 1.8* 2.3*   No results for input(s): LIPASE, AMYLASE in the last 168 hours. Recent Labs  Lab 03/07/17 0949 03/09/17 0955  AMMONIA 36* 22    CBC: Recent Labs  Lab 03/05/17 0841 03/07/17 0556 03/08/17 0940 03/09/17 0955 03/10/17 1141  WBC 11.9* 12.1* 12.2* 6.7 15.8*  HGB 8.7* 7.7* 7.8* 7.9* 8.4*  HCT 29.9* 26.1* 26.2* 26.0* 28.2*  MCV 89.0 89.1 86.5 85.5 86.2  PLT 437* 381 370 377 459*    Cardiac Enzymes: No results for input(s): CKTOTAL, CKMB, CKMBINDEX, TROPONINI in the last 168 hours.  BNP: Invalid input(s): POCBNP  CBG: No results for input(s): GLUCAP in  the last 168 hours.  Microbiology: Results for orders placed or performed during the hospital encounter of 02/21/17  Culture, blood (routine x 2)     Status: Abnormal   Collection Time: 02/22/17 10:05 AM  Result Value Ref Range Status   Specimen Description BLOOD RIGHT HAND  Final   Special Requests IN PEDIATRIC BOTTLE Blood Culture adequate volume  Final   Culture  Setup Time   Final    GRAM POSITIVE COCCI IN CLUSTERS IN PEDIATRIC BOTTLE CRITICAL RESULT CALLED TO, READ BACK BY AND VERIFIED WITH: PERRY RN AT 1430 ON [id number] BY [facility]    Culture (A)  Final    STAPHYLOCOCCUS SPECIES (COAGULASE NEGATIVE) THE SIGNIFICANCE OF ISOLATING THIS ORGANISM FROM A SINGLE SET OF BLOOD CULTURES WHEN MULTIPLE SETS ARE DRAWN IS UNCERTAIN. PLEASE NOTIFY THE MICROBIOLOGY DEPARTMENT WITHIN ONE WEEK IF SPECIATION AND SENSITIVITIES ARE REQUIRED. Performed at [facility], [address] [place], [place] [id number]    Report Status 02/25/2017 FINAL  Final  Blood Culture ID Panel (Reflexed)     Status: Abnormal   Collection Time: 02/22/17 10:05 AM  Result Value Ref Range Status   Enterococcus species NOT DETECTED NOT DETECTED Final   Listeria monocytogenes NOT DETECTED NOT  DETECTED Final   Staphylococcus species DETECTED (A) NOT DETECTED Final    Comment: Methicillin (oxacillin) resistant coagulase negative staphylococcus. Possible blood culture contaminant (unless isolated from more than one blood culture draw or clinical case suggests pathogenicity). No antibiotic treatment is indicated for blood  culture contaminants. CRITICAL RESULT CALLED TO, READ BACK BY AND VERIFIED WITH: LAUREN BAJBUS PHRAMD AT 1416 ON [id number] BY SJW    Staphylococcus aureus NOT DETECTED NOT DETECTED Final   Methicillin resistance DETECTED (A) NOT DETECTED Final    Comment: CRITICAL RESULT CALLED TO, READ BACK BY AND VERIFIED WITH: LAUREN BAJBUS PHARMD AT 1618 ON [id number] BY SJW    Streptococcus species NOT  DETECTED NOT DETECTED Final   Streptococcus agalactiae NOT DETECTED NOT DETECTED Final   Streptococcus pneumoniae NOT DETECTED NOT DETECTED Final   Streptococcus pyogenes NOT DETECTED NOT DETECTED Final   Acinetobacter baumannii NOT DETECTED NOT DETECTED Final   Enterobacteriaceae species NOT DETECTED NOT DETECTED Final   Enterobacter cloacae complex NOT DETECTED NOT DETECTED Final   Escherichia coli NOT DETECTED NOT DETECTED Final   Klebsiella oxytoca NOT DETECTED NOT DETECTED Final   Klebsiella pneumoniae NOT DETECTED NOT DETECTED Final   Proteus species NOT DETECTED NOT DETECTED Final   Serratia marcescens NOT DETECTED NOT DETECTED Final   Haemophilus influenzae NOT DETECTED NOT DETECTED Final   Neisseria meningitidis NOT DETECTED NOT DETECTED Final   Pseudomonas aeruginosa NOT DETECTED NOT DETECTED Final   Candida albicans NOT DETECTED NOT DETECTED Final   Candida glabrata NOT DETECTED NOT DETECTED Final   Candida krusei NOT DETECTED NOT DETECTED Final   Candida parapsilosis NOT DETECTED NOT DETECTED Final   Candida tropicalis NOT DETECTED NOT DETECTED Final    Comment: Performed at [facility], [address] [place], [place] [address]  Culture, blood (routine x 2)     Status: None   Collection Time: 02/22/17 10:12 AM  Result Value Ref Range Status   Specimen Description BLOOD RIGHT ARM  Final   Special Requests IN PEDIATRIC BOTTLE Blood Culture adequate volume  Final   Culture   Final    NO GROWTH 5 DAYS Performed at [facility], [address] [place], [place] [address]    Report Status 02/27/2017 FINAL  Final  C difficile quick scan w PCR reflex     Status: None   Collection Time: 02/22/17  2:00 PM  Result Value Ref Range Status   C Diff antigen NEGATIVE NEGATIVE Final   C Diff toxin NEGATIVE NEGATIVE Final   C Diff interpretation No C. difficile detected.  Final    Comment: Performed at [facility], [address], [place], [place] [address]   Culture, Urine     Status: None   Collection Time: 02/22/17  5:44 PM  Result Value Ref Range Status   Specimen Description URINE, RANDOM  Final   Special Requests NONE  Final   Culture   Final    NO GROWTH Performed at [facility], [address] [place], [place] [address]    Report Status 02/23/2017 FINAL  Final  Culture, expectorated sputum-assessment     Status: None   Collection Time: 02/22/17  8:33 PM  Result Value Ref Range Status   Specimen Description SPUTUM  Final   Special Requests NONE  Final   Sputum evaluation   Final    THIS SPECIMEN IS ACCEPTABLE FOR SPUTUM CULTURE Performed at [facility], [address], [place], [place] [address]    Report Status 02/23/2017 FINAL  Final  Culture, respiratory (NON-Expectorated)     Status: None   Collection Time: 02/22/17  8:33 PM  Result Value Ref Range Status   Specimen Description SPUTUM  Final   Special Requests NONE Reflexed from [id number]  Final   Gram Stain   Final    FEW WBC PRESENT, PREDOMINANTLY PMN RARE YEAST FEW GRAM POSITIVE COCCI IN CHAINS FEW SQUAMOUS EPITHELIAL CELLS PRESENT    Culture   Final    Consistent with normal respiratory flora. Performed at [facility], [address], [place], [place] [address]    Report Status 02/25/2017 FINAL  Final  Culture, Urine     Status: Abnormal   Collection Time: 03/05/17  3:23 AM  Result Value Ref Range Status   Specimen Description URINE, RANDOM  Final   Special Requests   Final    Normal Performed at [facility], [address], [place], [place] [address]    Culture 40,000 COLONIES/mL PSEUDOMONAS AERUGINOSA (A)  Final   Report Status 03/07/2017 FINAL  Final   Organism ID, Bacteria PSEUDOMONAS AERUGINOSA (A)  Final      Susceptibility   Pseudomonas aeruginosa - MIC*    CEFTAZIDIME 4 SENSITIVE Sensitive     CIPROFLOXACIN <=0.25 SENSITIVE Sensitive     GENTAMICIN <=1 SENSITIVE Sensitive     IMIPENEM 1 SENSITIVE Sensitive     PIP/TAZO  8 SENSITIVE Sensitive     CEFEPIME 2 SENSITIVE Sensitive     * 40,000 COLONIES/mL PSEUDOMONAS AERUGINOSA  Culture, blood (routine x 2)     Status: None (Preliminary result)   Collection Time: 03/06/17  7:45 PM  Result Value Ref Range Status   Specimen Description BLOOD RIGHT HAND  Final   Special Requests IN PEDIATRIC BOTTLE Blood Culture adequate volume  Final   Culture   Final    NO GROWTH 3 DAYS Performed at [facility], [address], [place], [place] [address]    Report Status PENDING  Incomplete  Culture, blood (routine x 2)     Status: None (Preliminary result)   Collection Time: 03/06/17  7:50 PM  Result Value Ref Range Status   Specimen Description BLOOD LEFT HAND  Final   Special Requests IN PEDIATRIC BOTTLE Blood Culture adequate volume  Final   Culture   Final    NO GROWTH 3 DAYS Performed at [facility], [address], [place], [place] [address]    Report Status PENDING  Incomplete    Coagulation Studies: No results for input(s): LABPROT, INR in the last 72 hours.  Urinalysis: No results for input(s): COLORURINE, LABSPEC, PHURINE, GLUCOSEU, HGBUR, BILIRUBINUR, KETONESUR, PROTEINUR, UROBILINOGEN, NITRITE, LEUKOCYTESUR in the last 72 hours.  Invalid input(s): APPERANCEUR    Imaging: Dg Chest Port 1 View  Result Date: 03/10/2017 CLINICAL DATA:  Aspiration, nausea and vomiting EXAM: PORTABLE CHEST 1 VIEW COMPARISON:  Chest x-rays dated 03/09/2017 and 02/22/2017 FINDINGS: Stable cardiomegaly. Overall cardiomediastinal silhouette is stable. Right IJ line is stable in position with tip at the level of the mid SVC. Left-sided PICC line is also stable in position with tip at the level of the mid/lower SVC. Lungs are clear. No pleural effusion or pneumothorax seen. Osseous structures about the chest are unremarkable. IMPRESSION: 1. No active disease.  No evidence of pneumonia or pulmonary edema. 2. Stable cardiomegaly. 3. Right IJ line and left-sided PICC line  are stable in  position. Electronically Signed   By: Bary Richard M.D.   On: 03/10/2017 12:34   Dg Chest Port 1 View  Result Date: 03/09/2017 CLINICAL DATA:  PICC placement. EXAM: PORTABLE CHEST 1 VIEW COMPARISON:  03/05/2017 FINDINGS: Left-sided PICC has its tip projecting in the lower superior vena cava just above the caval atrial junction. There is a right internal jugular dual-lumen central venous line with its tip in the lower superior vena cava just above the tip of the PICC. Lungs are clear.  No pneumothorax. Cardiac silhouette is normal in size. No mediastinal or hilar masses. IMPRESSION: 1. Left PICC tip projects in the lower superior vena cava. 2. No acute cardiopulmonary disease. Electronically Signed   By: Amie Portland M.D.   On: 03/09/2017 13:31     Medications:     lidocaine  Assessment/ Plan:  62 y.o. female with a PMHx of subclinical hyperthyroidism, chronic diarrhea, lymphedema, history of respiratory failure, morbid obesity, who was admitted to [facility] on 02/21/2017 for ongoing treatment of left hip infection and generalized debility. She sustained a left hip fracture on January 18, 2017 and had ORIF.  During surgery she unfortunately suffered a cardiac arrest and was sent to the ICU.  She had a second surgery on January 22, 2017.  Left hip failed fixation healed and patient had to have hardware removal and I&D of the left hip wound with wound VAC placement.   1.  Acute renal failure. 2.  Hyperkalemia. 3.  Hypotension. 4.  Hyponatremia.  Plan: Renal function has significantly improved.  BUN down to 17 with a creatinine of 0.85.  Good urine output noted.  Therefore no further indication for dialysis at the moment.  It appears that she is also had some aspiration.  Tachypnea noted at present.  Patient high risk for worsening respiratory function.  High risk for intubation as well.  Case discussed with hospitalist.  We will continue to monitor her progress closely however as  above she does not need any further dialysis at the moment.    LOS: 0 Avriel Kandel 3/4/20194:27 PM

## 2017-03-11 ENCOUNTER — Other Ambulatory Visit (HOSPITAL_COMMUNITY): Payer: Self-pay

## 2017-03-11 LAB — RENAL FUNCTION PANEL
ALBUMIN: 2.4 g/dL — AB (ref 3.5–5.0)
ANION GAP: 11 (ref 5–15)
BUN: 18 mg/dL (ref 6–20)
CHLORIDE: 107 mmol/L (ref 101–111)
CO2: 25 mmol/L (ref 22–32)
Calcium: 8.7 mg/dL — ABNORMAL LOW (ref 8.9–10.3)
Creatinine, Ser: 0.86 mg/dL (ref 0.44–1.00)
GFR calc non Af Amer: >60 mL/min (ref >60)
Glucose, Bld: 110 mg/dL — ABNORMAL HIGH (ref 65–99)
PHOSPHORUS: 4 mg/dL (ref 2.5–4.6)
POTASSIUM: 4.4 mmol/L (ref 3.5–5.1)
Sodium: 143 mmol/L (ref 135–145)

## 2017-03-11 LAB — CBC
HEMATOCRIT: 30 % — AB (ref 36.0–46.0)
HEMOGLOBIN: 8.6 g/dL — AB (ref 12.0–15.0)
MCH: 25.5 pg — ABNORMAL LOW (ref 26.0–34.0)
MCHC: 28.7 g/dL — ABNORMAL LOW (ref 30.0–36.0)
MCV: 89 fL (ref 78.0–100.0)
Platelets: 416 10*3/uL — ABNORMAL HIGH (ref 150–400)
RBC: 3.37 MIL/uL — ABNORMAL LOW (ref 3.87–5.11)
RDW: 16.3 % — ABNORMAL HIGH (ref 11.5–15.5)
WBC: 18 10*3/uL — AB (ref 4.0–10.5)

## 2017-03-11 LAB — MAGNESIUM: Magnesium: 1.8 mg/dL (ref 1.7–2.4)

## 2017-03-12 ENCOUNTER — Other Ambulatory Visit (HOSPITAL_COMMUNITY): Payer: Self-pay

## 2017-03-12 LAB — BASIC METABOLIC PANEL
Anion gap: 7 (ref 5–15)
BUN: 18 mg/dL (ref 6–20)
CHLORIDE: 107 mmol/L (ref 101–111)
CO2: 30 mmol/L (ref 22–32)
CREATININE: 0.79 mg/dL (ref 0.44–1.00)
Calcium: 8.8 mg/dL — ABNORMAL LOW (ref 8.9–10.3)
GFR calc Af Amer: >60 mL/min (ref >60)
GFR calc non Af Amer: >60 mL/min (ref >60)
GLUCOSE: 139 mg/dL — AB (ref 65–99)
POTASSIUM: 4.2 mmol/L (ref 3.5–5.1)
Sodium: 144 mmol/L (ref 135–145)

## 2017-03-12 LAB — CBC
HCT: 29.6 % — ABNORMAL LOW (ref 36.0–46.0)
HEMOGLOBIN: 8.3 g/dL — AB (ref 12.0–15.0)
MCH: 26.3 pg (ref 26.0–34.0)
MCHC: 28 g/dL — AB (ref 30.0–36.0)
MCV: 93.7 fL (ref 78.0–100.0)
Platelets: 429 10*3/uL — ABNORMAL HIGH (ref 150–400)
RBC: 3.16 MIL/uL — AB (ref 3.87–5.11)
RDW: 16.5 % — ABNORMAL HIGH (ref 11.5–15.5)
WBC: 14.4 10*3/uL — ABNORMAL HIGH (ref 4.0–10.5)

## 2017-03-12 LAB — C-REACTIVE PROTEIN: CRP: 18 mg/dL — ABNORMAL HIGH (ref <1.0)

## 2017-03-12 LAB — SEDIMENTATION RATE: SED RATE: 112 mm/h — AB (ref 0–22)

## 2017-03-12 LAB — CULTURE, BLOOD (ROUTINE X 2)
CULTURE: NO GROWTH
Culture: NO GROWTH
Special Requests: ADEQUATE
Special Requests: ADEQUATE

## 2017-03-12 LAB — MAGNESIUM: Magnesium: 1.9 mg/dL (ref 1.7–2.4)

## 2017-03-12 LAB — PHOSPHORUS: Phosphorus: 3.2 mg/dL (ref 2.5–4.6)

## 2017-03-13 ENCOUNTER — Other Ambulatory Visit (HOSPITAL_COMMUNITY): Payer: Self-pay

## 2017-03-13 LAB — BASIC METABOLIC PANEL
Anion gap: 10 (ref 5–15)
BUN: 21 mg/dL — AB (ref 6–20)
CO2: 31 mmol/L (ref 22–32)
CREATININE: 0.85 mg/dL (ref 0.44–1.00)
Calcium: 8.8 mg/dL — ABNORMAL LOW (ref 8.9–10.3)
Chloride: 103 mmol/L (ref 101–111)
GFR calc Af Amer: >60 mL/min (ref >60)
GFR calc non Af Amer: >60 mL/min (ref >60)
Glucose, Bld: 110 mg/dL — ABNORMAL HIGH (ref 65–99)
POTASSIUM: 3.8 mmol/L (ref 3.5–5.1)
Sodium: 144 mmol/L (ref 135–145)

## 2017-03-13 LAB — CBC
HCT: 30 % — ABNORMAL LOW (ref 36.0–46.0)
Hemoglobin: 8.3 g/dL — ABNORMAL LOW (ref 12.0–15.0)
MCH: 25.5 pg — ABNORMAL LOW (ref 26.0–34.0)
MCHC: 27.7 g/dL — ABNORMAL LOW (ref 30.0–36.0)
MCV: 92.3 fL (ref 78.0–100.0)
Platelets: 409 10*3/uL — ABNORMAL HIGH (ref 150–400)
RBC: 3.25 MIL/uL — ABNORMAL LOW (ref 3.87–5.11)
RDW: 16.5 % — ABNORMAL HIGH (ref 11.5–15.5)
WBC: 13.2 10*3/uL — AB (ref 4.0–10.5)

## 2017-03-13 MED ORDER — GENERIC EXTERNAL MEDICATION
Status: DC
Start: ? — End: 2017-03-13

## 2017-03-14 LAB — CULTURE, RESPIRATORY W GRAM STAIN: Culture: NORMAL

## 2017-03-14 LAB — CULTURE, RESPIRATORY

## 2017-03-15 LAB — RENAL FUNCTION PANEL
Albumin: 2.2 g/dL — ABNORMAL LOW (ref 3.5–5.0)
Anion gap: 12 (ref 5–15)
BUN: 15 mg/dL (ref 6–20)
CALCIUM: 8.5 mg/dL — AB (ref 8.9–10.3)
CO2: 34 mmol/L — ABNORMAL HIGH (ref 22–32)
CREATININE: 0.66 mg/dL (ref 0.44–1.00)
Chloride: 97 mmol/L — ABNORMAL LOW (ref 101–111)
Glucose, Bld: 97 mg/dL (ref 65–99)
PHOSPHORUS: 2.1 mg/dL — AB (ref 2.5–4.6)
Potassium: 3 mmol/L — ABNORMAL LOW (ref 3.5–5.1)
SODIUM: 143 mmol/L (ref 135–145)

## 2017-03-15 LAB — CBC
HCT: 27.9 % — ABNORMAL LOW (ref 36.0–46.0)
HEMOGLOBIN: 8.4 g/dL — AB (ref 12.0–15.0)
MCH: 26.5 pg (ref 26.0–34.0)
MCHC: 30.1 g/dL (ref 30.0–36.0)
MCV: 88 fL (ref 78.0–100.0)
PLATELETS: 347 10*3/uL (ref 150–400)
RBC: 3.17 MIL/uL — ABNORMAL LOW (ref 3.87–5.11)
RDW: 16.9 % — ABNORMAL HIGH (ref 11.5–15.5)
WBC: 12 10*3/uL — AB (ref 4.0–10.5)

## 2017-03-15 LAB — MAGNESIUM: MAGNESIUM: 1.5 mg/dL — AB (ref 1.7–2.4)

## 2017-03-16 LAB — RENAL FUNCTION PANEL
Albumin: 2.2 g/dL — ABNORMAL LOW (ref 3.5–5.0)
Anion gap: 10 (ref 5–15)
BUN: 12 mg/dL (ref 6–20)
CHLORIDE: 97 mmol/L — AB (ref 101–111)
CO2: 35 mmol/L — AB (ref 22–32)
CREATININE: 0.66 mg/dL (ref 0.44–1.00)
Calcium: 8.3 mg/dL — ABNORMAL LOW (ref 8.9–10.3)
GFR calc Af Amer: >60 mL/min (ref >60)
GFR calc non Af Amer: >60 mL/min (ref >60)
GLUCOSE: 102 mg/dL — AB (ref 65–99)
Phosphorus: 2.6 mg/dL (ref 2.5–4.6)
Potassium: 3.6 mmol/L (ref 3.5–5.1)
Sodium: 142 mmol/L (ref 135–145)

## 2017-03-16 LAB — CBC
HCT: 26.9 % — ABNORMAL LOW (ref 36.0–46.0)
Hemoglobin: 8.2 g/dL — ABNORMAL LOW (ref 12.0–15.0)
MCH: 26.9 pg (ref 26.0–34.0)
MCHC: 30.5 g/dL (ref 30.0–36.0)
MCV: 88.2 fL (ref 78.0–100.0)
PLATELETS: 323 10*3/uL (ref 150–400)
RBC: 3.05 MIL/uL — ABNORMAL LOW (ref 3.87–5.11)
RDW: 17 % — ABNORMAL HIGH (ref 11.5–15.5)
WBC: 10.5 10*3/uL (ref 4.0–10.5)

## 2017-03-16 LAB — MAGNESIUM: Magnesium: 1.8 mg/dL (ref 1.7–2.4)

## 2017-03-18 LAB — MAGNESIUM: Magnesium: 1.7 mg/dL (ref 1.7–2.4)

## 2017-03-18 LAB — BASIC METABOLIC PANEL
ANION GAP: 8 (ref 5–15)
BUN: 12 mg/dL (ref 6–20)
CALCIUM: 8.4 mg/dL — AB (ref 8.9–10.3)
CO2: 40 mmol/L — ABNORMAL HIGH (ref 22–32)
CREATININE: 0.72 mg/dL (ref 0.44–1.00)
Chloride: 95 mmol/L — ABNORMAL LOW (ref 101–111)
GFR calc Af Amer: >60 mL/min (ref >60)
GFR calc non Af Amer: >60 mL/min (ref >60)
GLUCOSE: 105 mg/dL — AB (ref 65–99)
Potassium: 3.9 mmol/L (ref 3.5–5.1)
Sodium: 143 mmol/L (ref 135–145)

## 2017-03-18 LAB — URINALYSIS, ROUTINE W REFLEX MICROSCOPIC
BILIRUBIN URINE: NEGATIVE
GLUCOSE, UA: NEGATIVE mg/dL
HGB URINE DIPSTICK: NEGATIVE
Ketones, ur: NEGATIVE mg/dL
Leukocytes, UA: NEGATIVE
NITRITE: NEGATIVE
PH: 5 (ref 5.0–8.0)
Protein, ur: NEGATIVE mg/dL
SPECIFIC GRAVITY, URINE: 1.018 (ref 1.005–1.030)

## 2017-03-18 LAB — CBC
HCT: 29.8 % — ABNORMAL LOW (ref 36.0–46.0)
Hemoglobin: 8.4 g/dL — ABNORMAL LOW (ref 12.0–15.0)
MCH: 25.3 pg — ABNORMAL LOW (ref 26.0–34.0)
MCHC: 28.2 g/dL — AB (ref 30.0–36.0)
MCV: 89.8 fL (ref 78.0–100.0)
PLATELETS: 286 10*3/uL (ref 150–400)
RBC: 3.32 MIL/uL — ABNORMAL LOW (ref 3.87–5.11)
RDW: 17.2 % — AB (ref 11.5–15.5)
WBC: 13.1 10*3/uL — AB (ref 4.0–10.5)

## 2017-03-19 LAB — URINE CULTURE: Culture: NO GROWTH

## 2017-03-19 LAB — MAGNESIUM: Magnesium: 2 mg/dL (ref 1.7–2.4)

## 2017-03-20 LAB — BASIC METABOLIC PANEL
Anion gap: 13 (ref 5–15)
Anion gap: 15 (ref 5–15)
BUN: 14 mg/dL (ref 6–20)
BUN: 13 mg/dL (ref 6–20)
CALCIUM: 8.7 mg/dL — AB (ref 8.9–10.3)
CALCIUM: 8.8 mg/dL — AB (ref 8.9–10.3)
CO2: 31 mmol/L (ref 22–32)
CO2: 31 mmol/L (ref 22–32)
CREATININE: 0.63 mg/dL (ref 0.44–1.00)
Chloride: 98 mmol/L — ABNORMAL LOW (ref 101–111)
Chloride: 98 mmol/L — ABNORMAL LOW (ref 101–111)
Creatinine, Ser: 0.69 mg/dL (ref 0.44–1.00)
GFR calc Af Amer: >60 mL/min (ref >60)
GFR calc Af Amer: >60 mL/min (ref >60)
GLUCOSE: 99 mg/dL (ref 65–99)
GLUCOSE: 118 mg/dL — AB (ref 65–99)
Potassium: 6 mmol/L — ABNORMAL HIGH (ref 3.5–5.1)
Potassium: 5.6 mmol/L — ABNORMAL HIGH (ref 3.5–5.1)
Sodium: 142 mmol/L (ref 135–145)
Sodium: 144 mmol/L (ref 135–145)

## 2017-03-21 LAB — POTASSIUM: Potassium: 4.1 mmol/L (ref 3.5–5.1)

## 2017-03-24 LAB — BASIC METABOLIC PANEL
ANION GAP: 9 (ref 5–15)
BUN: 17 mg/dL (ref 6–20)
CALCIUM: 8.9 mg/dL (ref 8.9–10.3)
CHLORIDE: 95 mmol/L — AB (ref 101–111)
CO2: 35 mmol/L — AB (ref 22–32)
Creatinine, Ser: 0.73 mg/dL (ref 0.44–1.00)
GFR calc Af Amer: >60 mL/min (ref >60)
GFR calc non Af Amer: >60 mL/min (ref >60)
GLUCOSE: 110 mg/dL — AB (ref 65–99)
Potassium: 4.6 mmol/L (ref 3.5–5.1)
Sodium: 139 mmol/L (ref 135–145)

## 2017-03-24 LAB — CBC
HEMATOCRIT: 33 % — AB (ref 36.0–46.0)
HEMOGLOBIN: 9.5 g/dL — AB (ref 12.0–15.0)
MCH: 25.5 pg — AB (ref 26.0–34.0)
MCHC: 28.8 g/dL — ABNORMAL LOW (ref 30.0–36.0)
MCV: 88.5 fL (ref 78.0–100.0)
Platelets: 358 10*3/uL (ref 150–400)
RBC: 3.73 MIL/uL — ABNORMAL LOW (ref 3.87–5.11)
RDW: 16.7 % — ABNORMAL HIGH (ref 11.5–15.5)
WBC: 10.6 10*3/uL — AB (ref 4.0–10.5)

## 2017-03-24 LAB — MAGNESIUM: Magnesium: 1.9 mg/dL (ref 1.7–2.4)

## 2017-03-27 LAB — RENAL FUNCTION PANEL
ALBUMIN: 2.3 g/dL — AB (ref 3.5–5.0)
Anion gap: 9 (ref 5–15)
BUN: 25 mg/dL — ABNORMAL HIGH (ref 6–20)
CALCIUM: 8.6 mg/dL — AB (ref 8.9–10.3)
CO2: 34 mmol/L — ABNORMAL HIGH (ref 22–32)
Chloride: 93 mmol/L — ABNORMAL LOW (ref 101–111)
Creatinine, Ser: 0.88 mg/dL (ref 0.44–1.00)
Glucose, Bld: 115 mg/dL — ABNORMAL HIGH (ref 65–99)
PHOSPHORUS: 4.7 mg/dL — AB (ref 2.5–4.6)
POTASSIUM: 4.5 mmol/L (ref 3.5–5.1)
SODIUM: 136 mmol/L (ref 135–145)

## 2017-03-27 LAB — CBC
HEMATOCRIT: 30.8 % — AB (ref 36.0–46.0)
HEMOGLOBIN: 9.3 g/dL — AB (ref 12.0–15.0)
MCH: 26.4 pg (ref 26.0–34.0)
MCHC: 30.2 g/dL (ref 30.0–36.0)
MCV: 87.5 fL (ref 78.0–100.0)
Platelets: 410 10*3/uL — ABNORMAL HIGH (ref 150–400)
RBC: 3.52 MIL/uL — ABNORMAL LOW (ref 3.87–5.11)
RDW: 16.9 % — AB (ref 11.5–15.5)
WBC: 11.9 10*3/uL — AB (ref 4.0–10.5)

## 2017-03-27 LAB — MAGNESIUM: Magnesium: 1.8 mg/dL (ref 1.7–2.4)

## 2017-03-30 LAB — CBC
HEMATOCRIT: 27.6 % — AB (ref 36.0–46.0)
Hemoglobin: 8.1 g/dL — ABNORMAL LOW (ref 12.0–15.0)
MCH: 25.6 pg — AB (ref 26.0–34.0)
MCHC: 29.3 g/dL — AB (ref 30.0–36.0)
MCV: 87.1 fL (ref 78.0–100.0)
PLATELETS: 419 10*3/uL — AB (ref 150–400)
RBC: 3.17 MIL/uL — ABNORMAL LOW (ref 3.87–5.11)
RDW: 16.6 % — AB (ref 11.5–15.5)
WBC: 11.9 10*3/uL — AB (ref 4.0–10.5)

## 2017-03-30 LAB — RENAL FUNCTION PANEL
Albumin: 2.1 g/dL — ABNORMAL LOW (ref 3.5–5.0)
Anion gap: 10 (ref 5–15)
BUN: 17 mg/dL (ref 6–20)
CHLORIDE: 97 mmol/L — AB (ref 101–111)
CO2: 32 mmol/L (ref 22–32)
CREATININE: 0.77 mg/dL (ref 0.44–1.00)
Calcium: 8.6 mg/dL — ABNORMAL LOW (ref 8.9–10.3)
GFR calc Af Amer: >60 mL/min (ref >60)
GLUCOSE: 102 mg/dL — AB (ref 65–99)
POTASSIUM: 4.2 mmol/L (ref 3.5–5.1)
Phosphorus: 4.7 mg/dL — ABNORMAL HIGH (ref 2.5–4.6)
Sodium: 139 mmol/L (ref 135–145)

## 2017-03-30 LAB — MAGNESIUM: Magnesium: 1.9 mg/dL (ref 1.7–2.4)

## 2018-02-07 DEATH — deceased

## 2018-12-13 IMAGING — US US RENAL
1 series · 14 of 24 positions shown · non-contrast
Comparison: None.

CLINICAL DATA: Encounter for fitting and adjustment of dialysis
catheter. Acute renal failure.

EXAM:
RENAL / URINARY TRACT ULTRASOUND COMPLETE

[Series 1: us renal · 0.31mm/px · 14 of 24 slices shown]
[im 1/24]
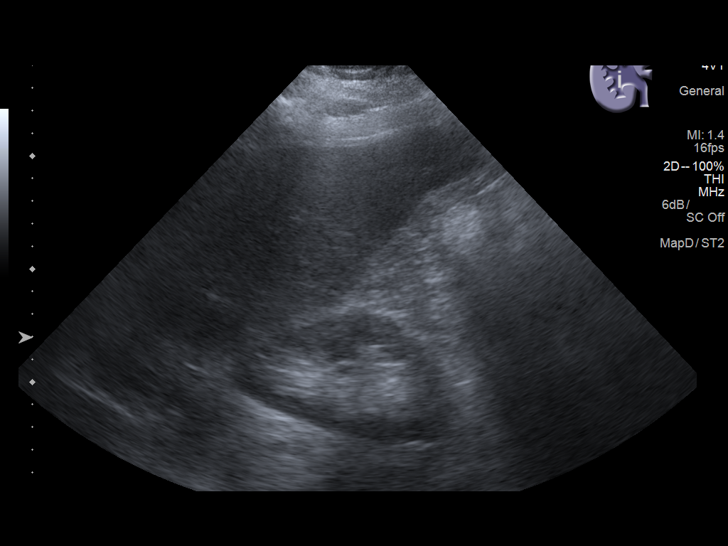
[im 3/24]
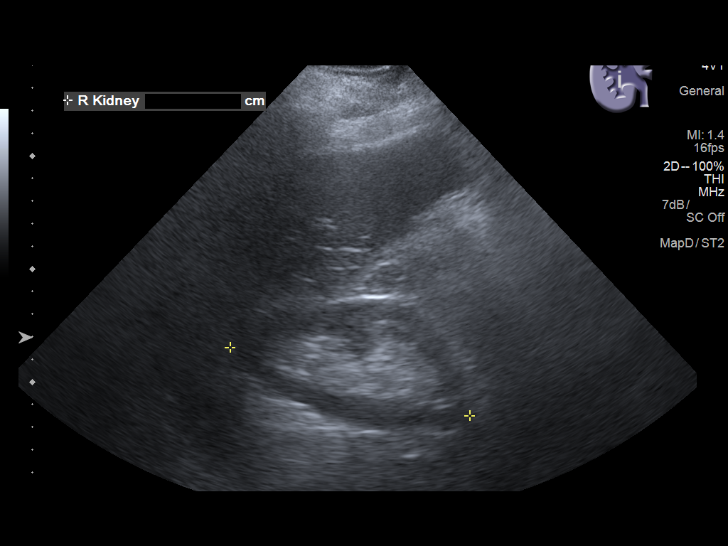
[im 5/24]
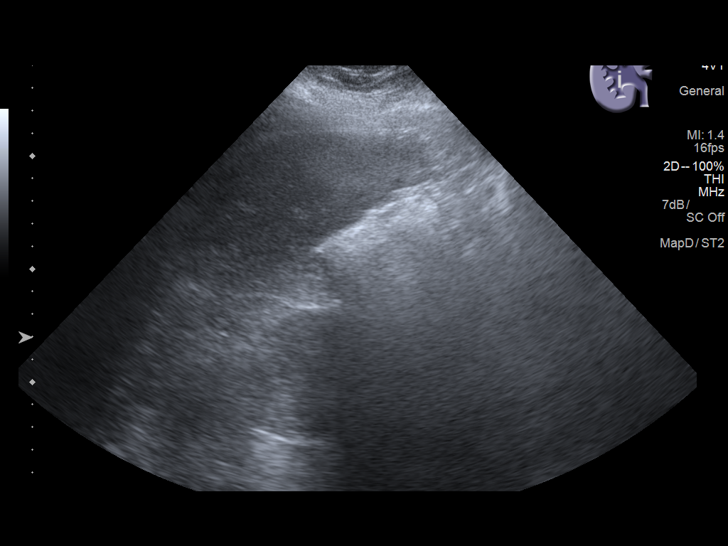
[im 7/24]
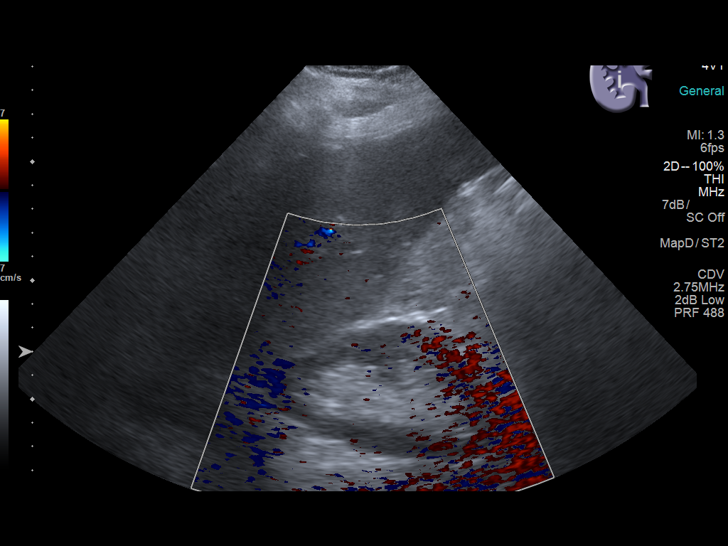
[im 8/24]
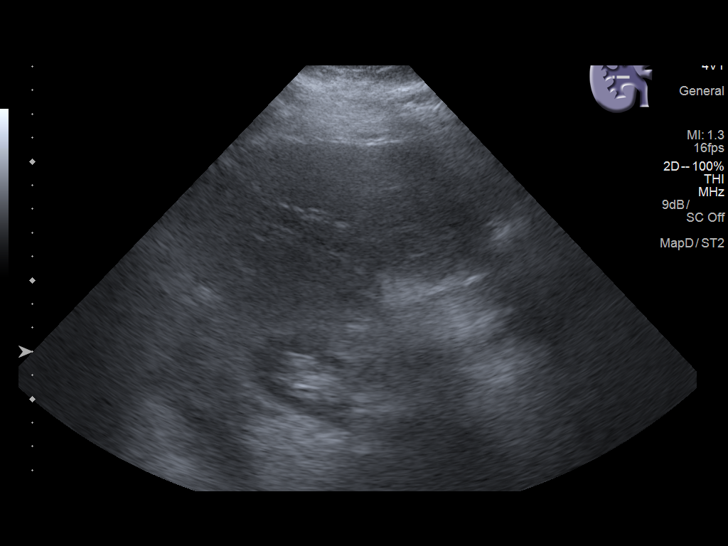
[im 10/24]
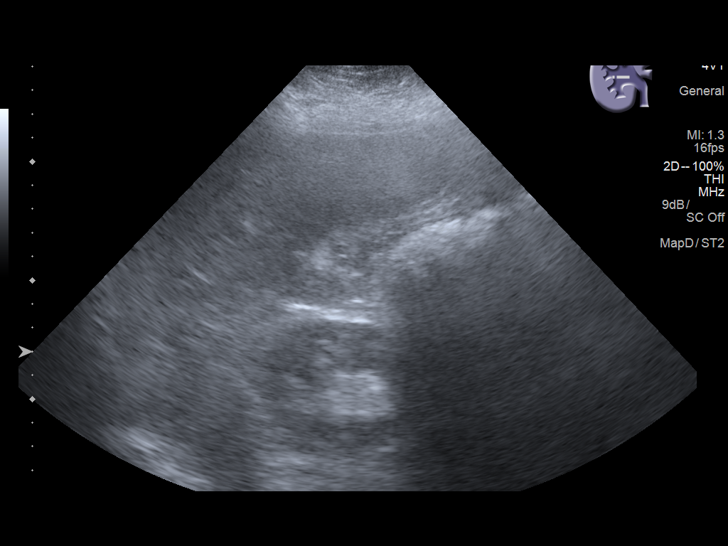
[im 12/24]
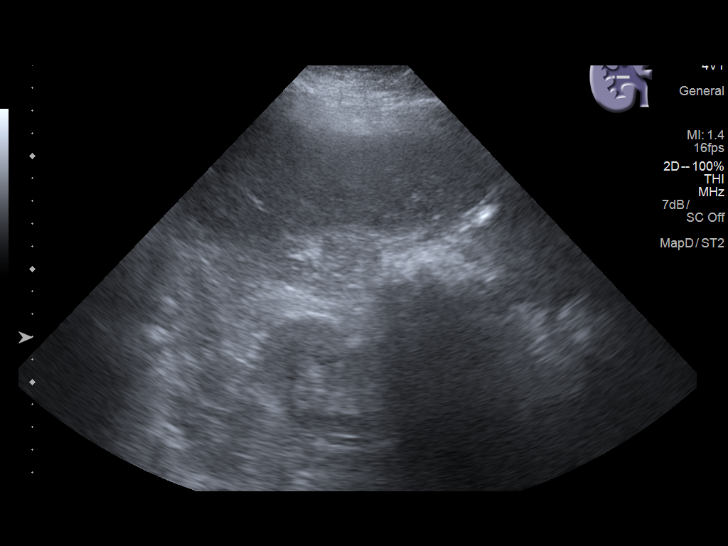
[im 13/24]
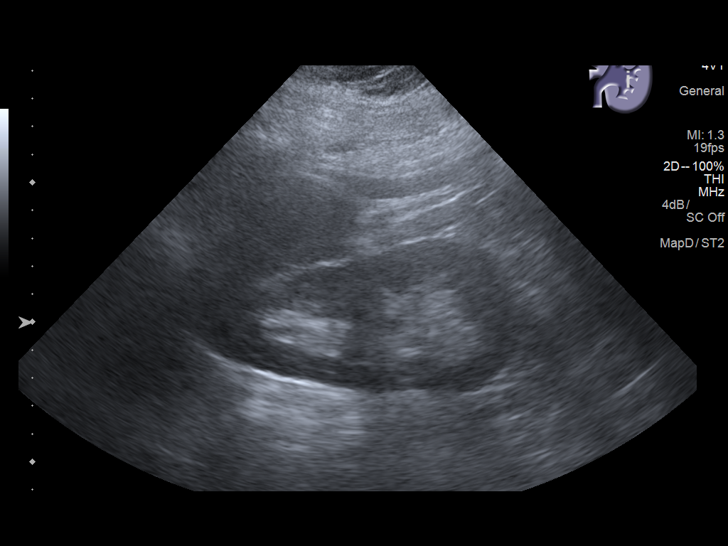
[im 15/24]
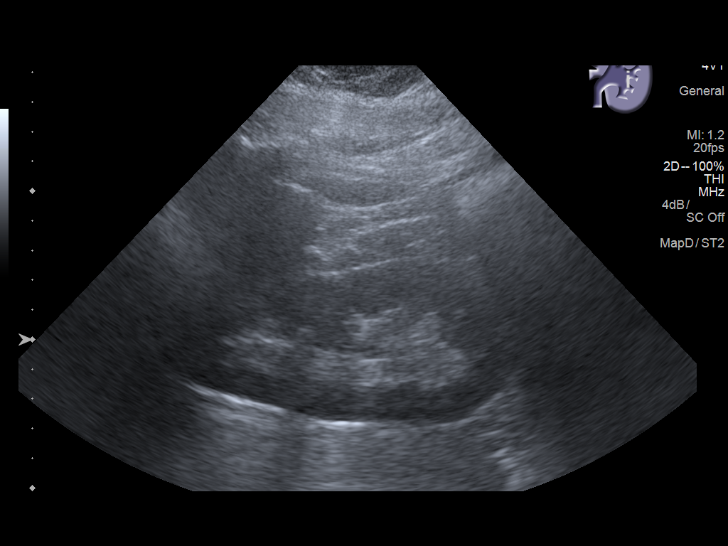
[im 17/24]
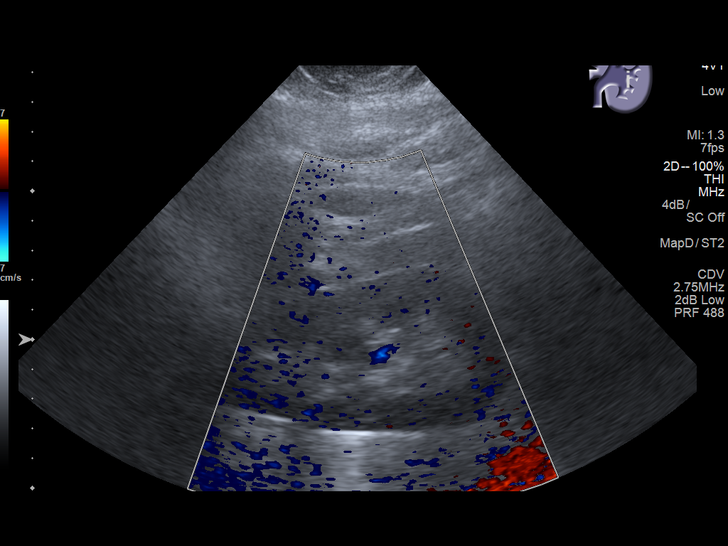
[im 19/24]
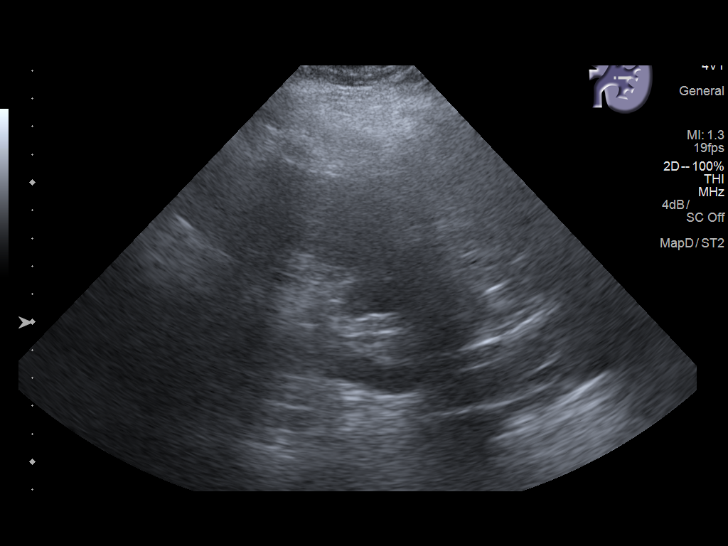
[im 20/24]
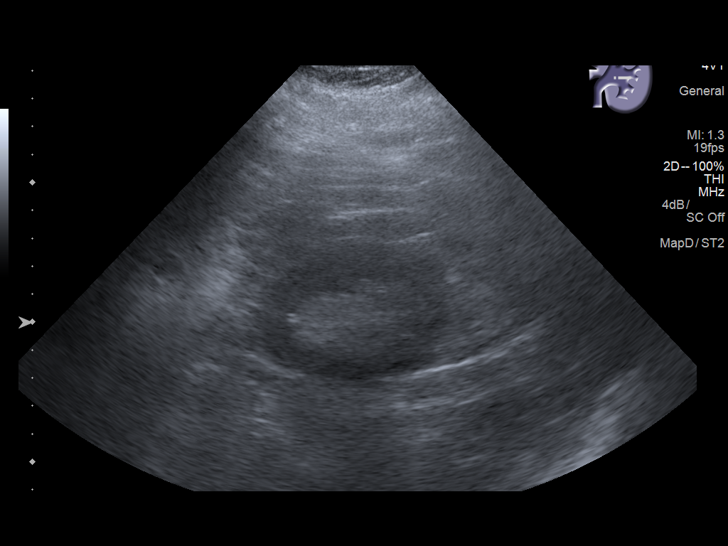
[im 22/24]
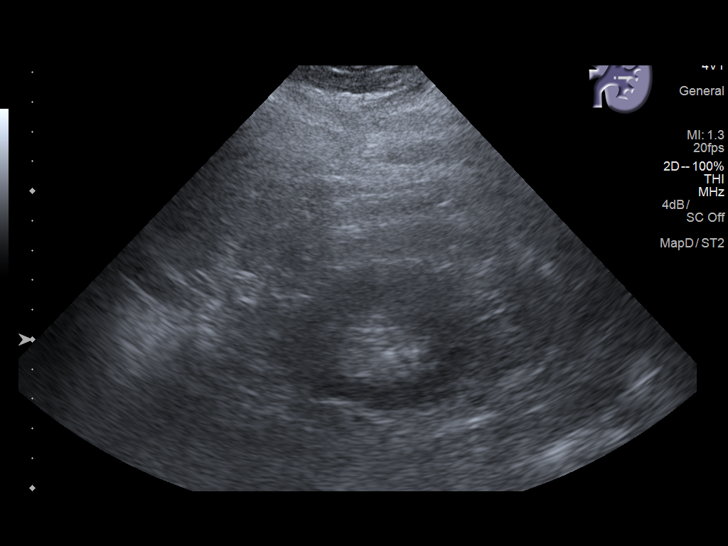
[im 24/24]
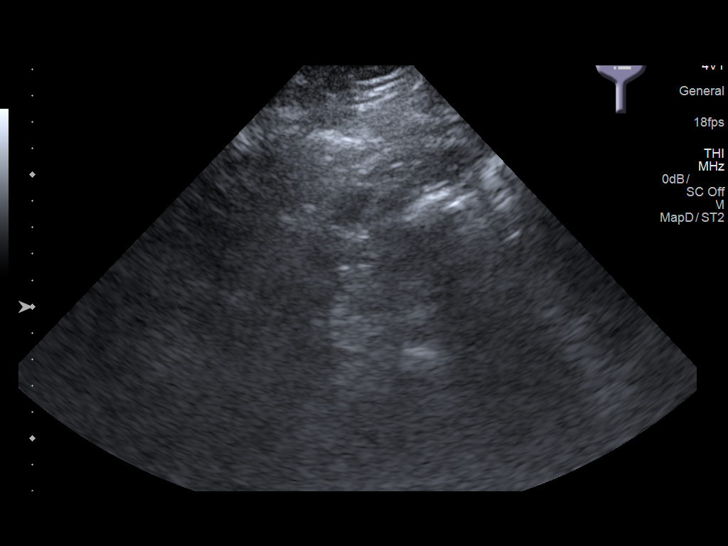

[14 of 24 positions shown; findings below may reference images not displayed]

FINDINGS: Right Kidney:

Length: 11.0 cm. Echogenicity within normal limits. No mass or
hydronephrosis visualized.

Left Kidney:

Length: 11.6 cm. Echogenicity within normal limits. No mass or
hydronephrosis visualized.

Bladder:

Not visualized.
IMPRESSION: Normal sonographic appearance of the kidneys.  No hydronephrosis.

## 2018-12-31 IMAGING — DX DG CHEST 1V PORT
1 series · 1 of 1 positions shown · non-contrast
Comparison: 03/05/2017

CLINICAL DATA: PICC placement.

EXAM:
PORTABLE CHEST 1 VIEW

[chest]
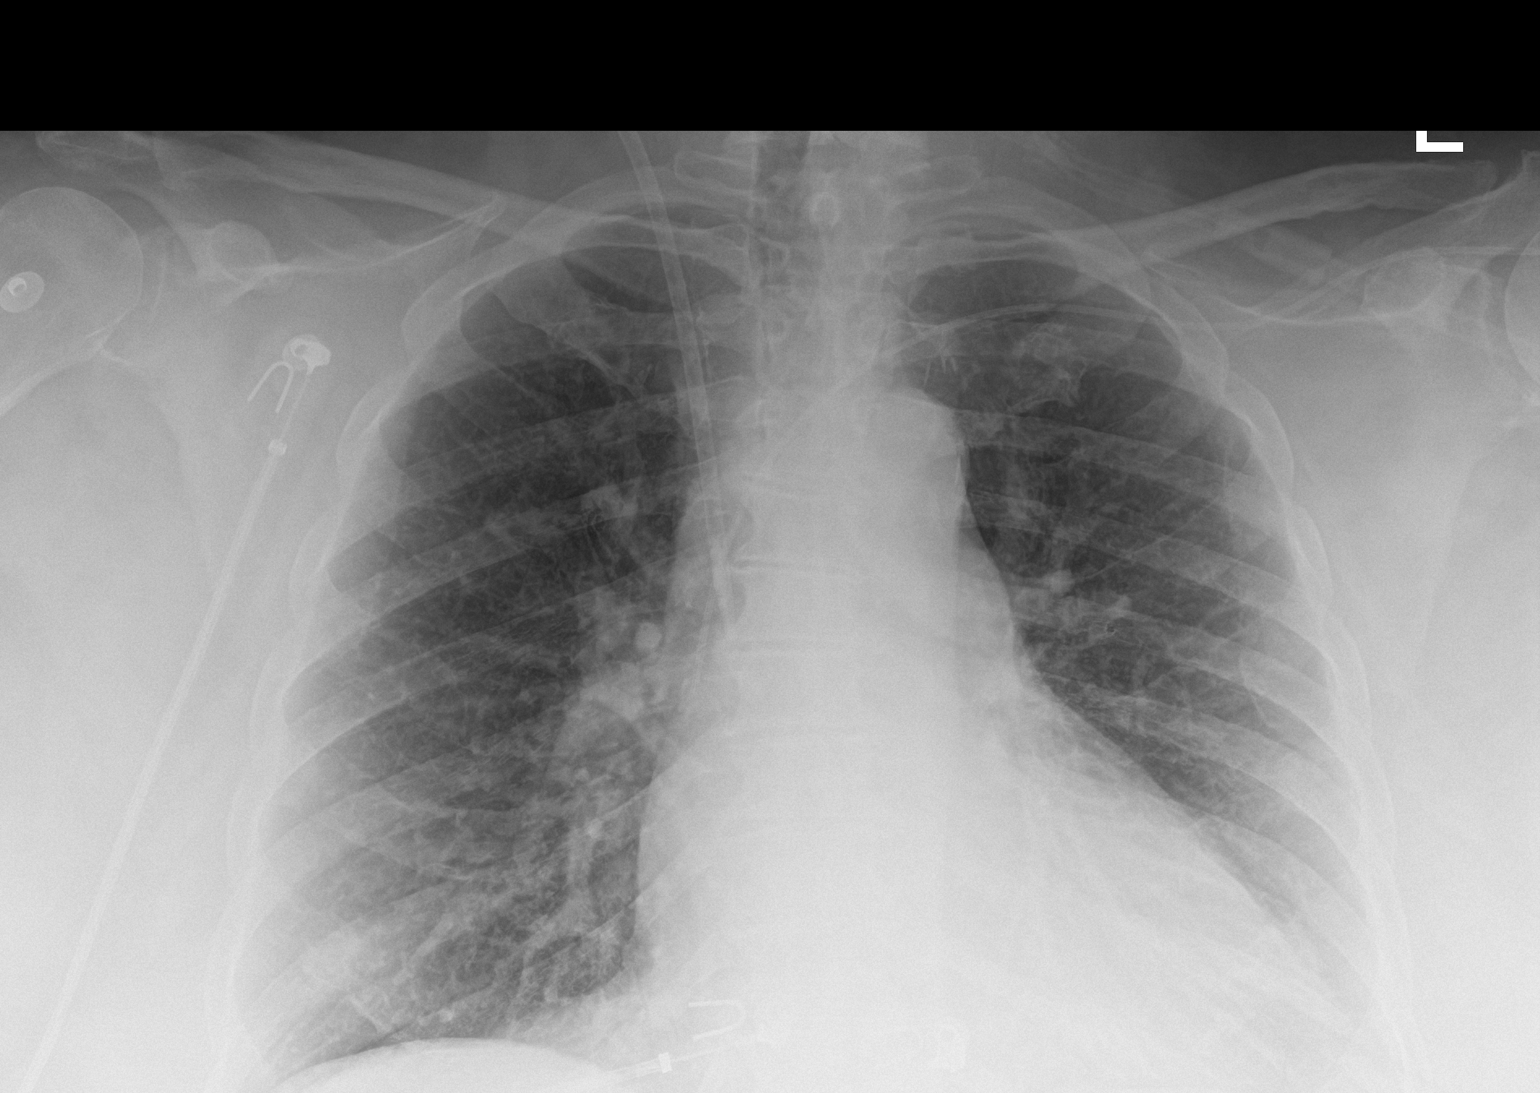

[1 of 1 positions shown; findings below may reference images not displayed]

FINDINGS: Left-sided PICC has its tip projecting in the lower superior vena
cava just above the caval atrial junction.

There is a right internal jugular dual-lumen central venous line
with its tip in the lower superior vena cava just above the tip of
the PICC.

Lungs are clear.  No pneumothorax.

Cardiac silhouette is normal in size. No mediastinal or hilar
masses.
IMPRESSION: 1. Left PICC tip projects in the lower superior vena cava.
2. No acute cardiopulmonary disease.

## 2019-01-04 IMAGING — DX DG ABD PORTABLE 1V
1 series · 2 of 2 positions shown · non-contrast
Comparison: 03/11/2017

CLINICAL DATA: NG tube

EXAM:
PORTABLE ABDOMEN - 1 VIEW

[Series 1: abdomen · 0.14mm/px · 2 of 2 slices shown]
[im 1/2]
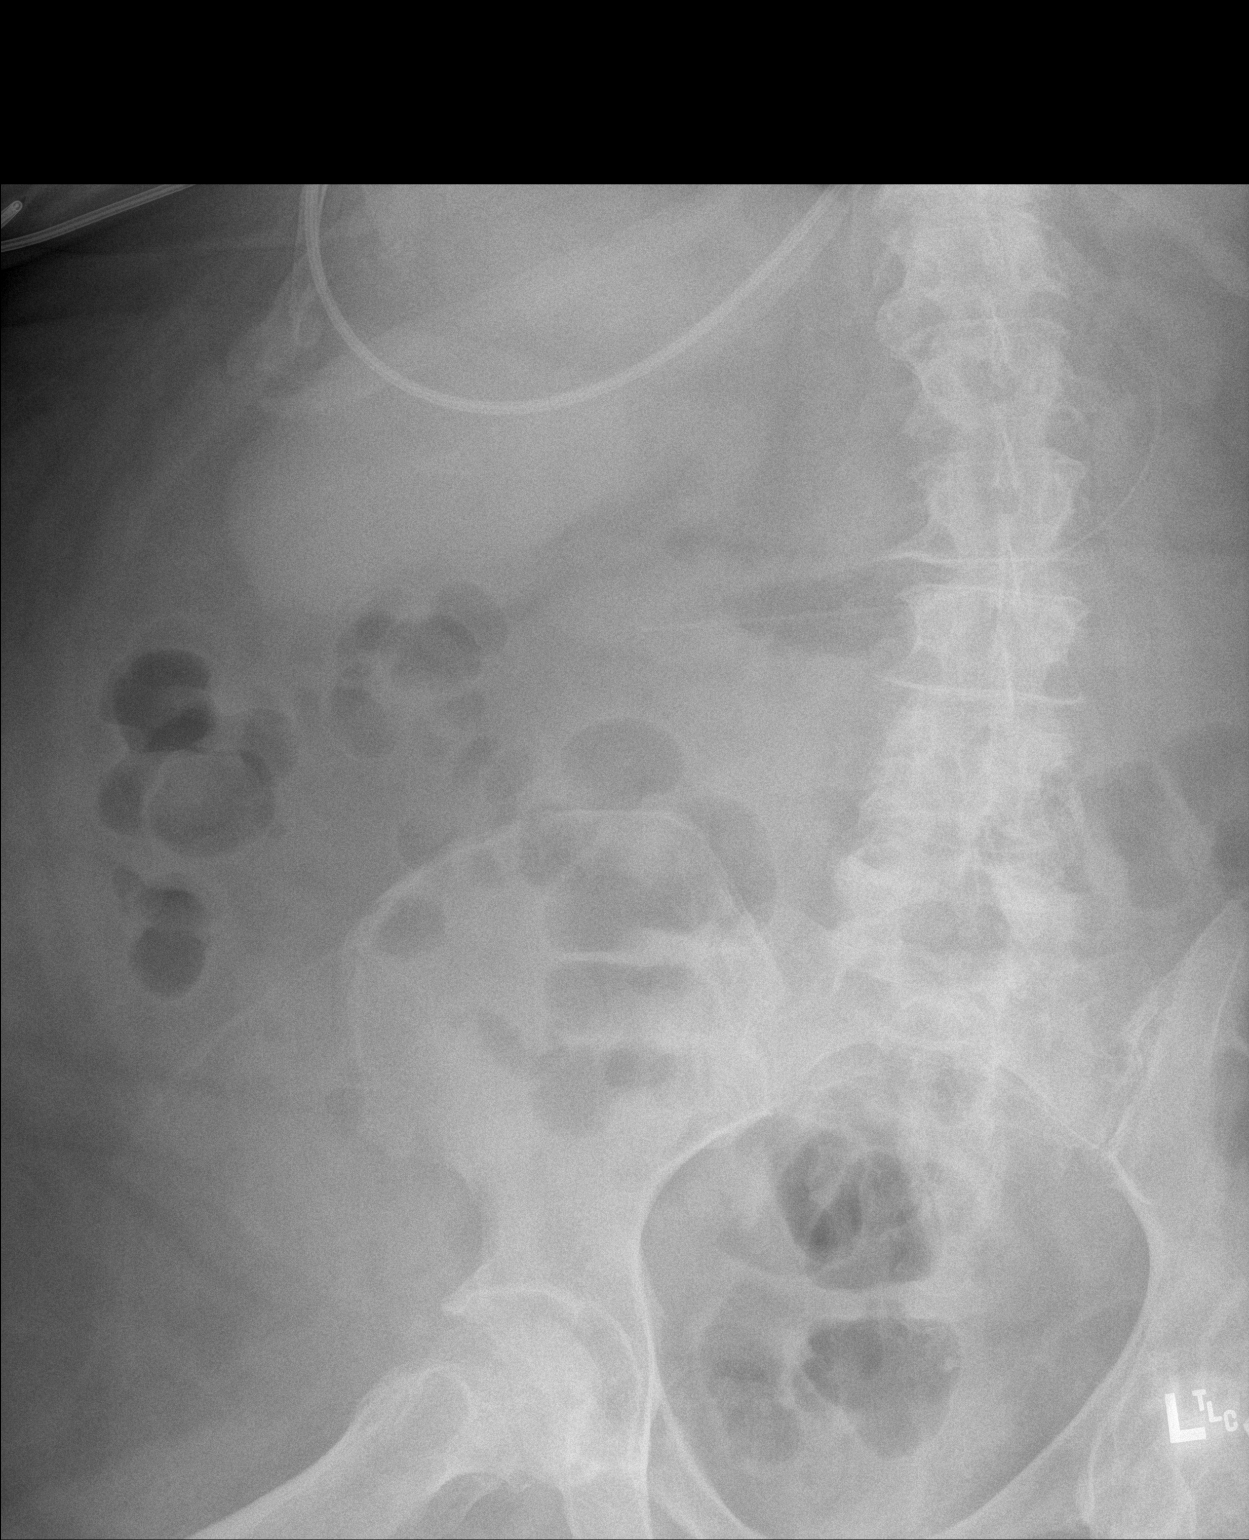
[im 2/2]
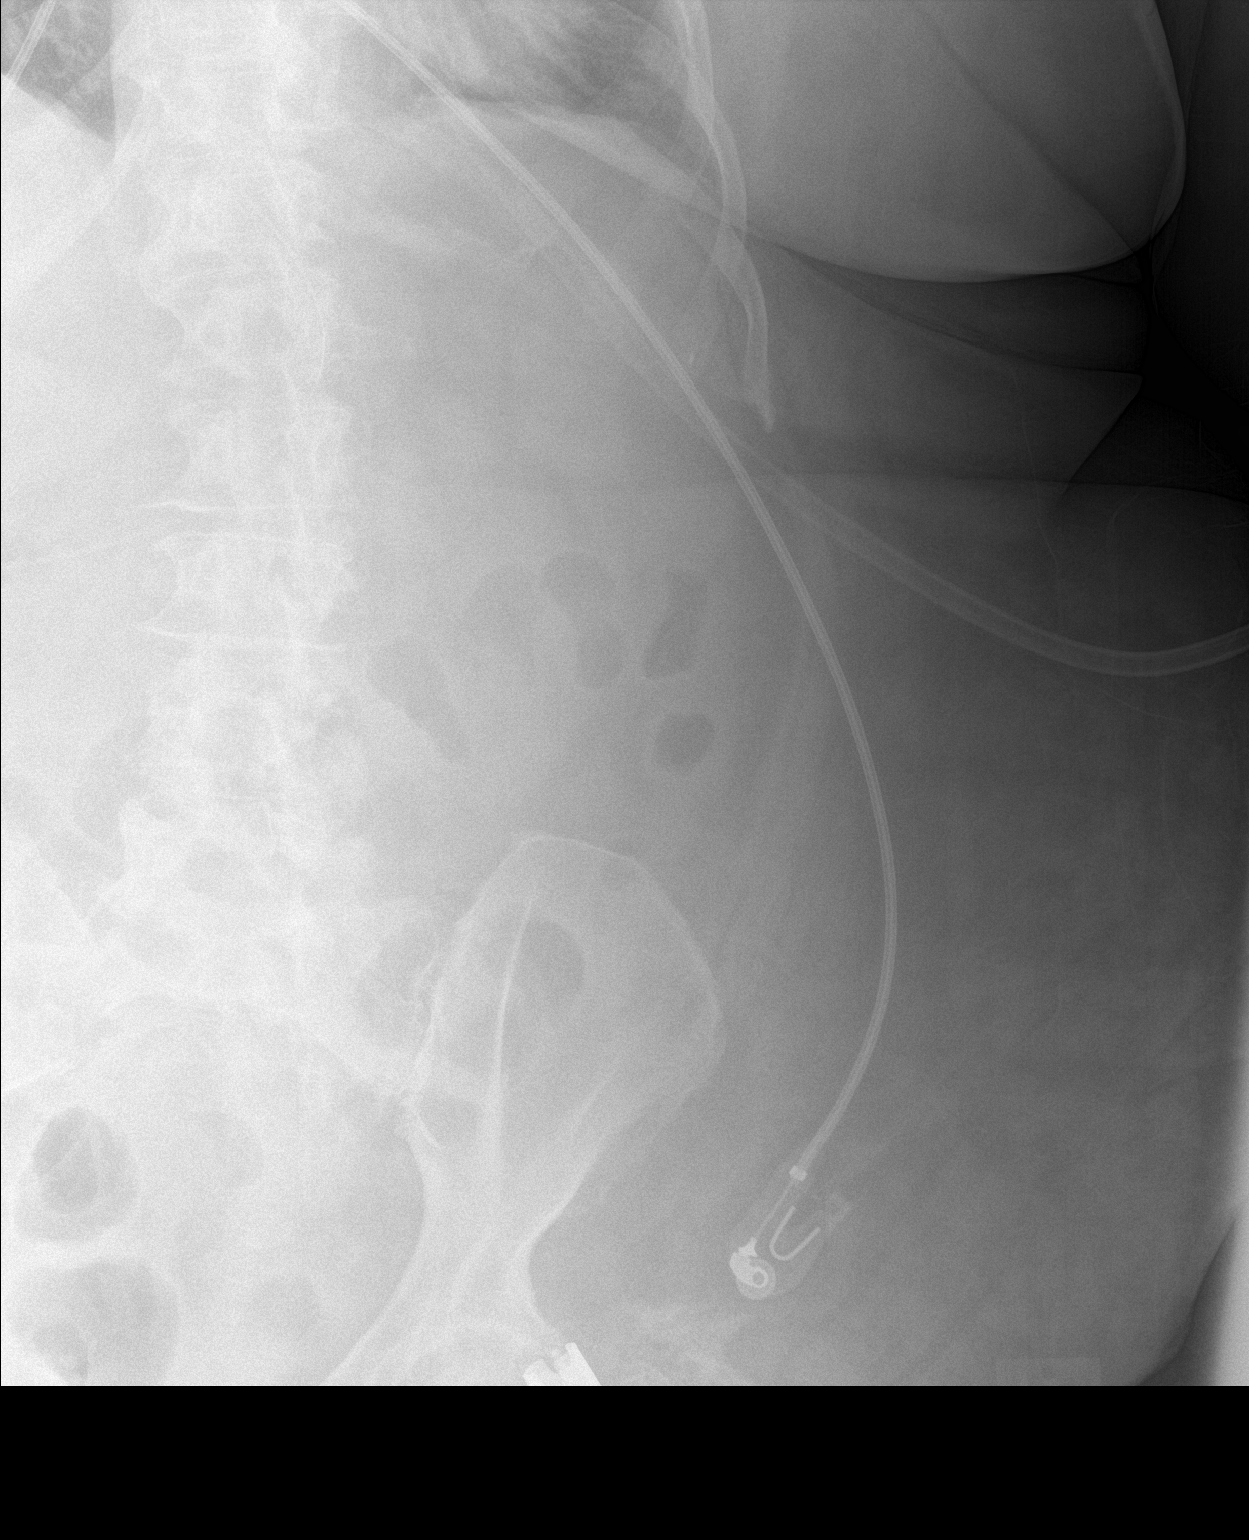

[2 of 2 positions shown; findings below may reference images not displayed]

FINDINGS: NG tube tip is in the distal stomach or possibly just into the
duodenal bulb. Nonobstructive bowel gas pattern.
IMPRESSION: NG tube tip in the peripyloric region.
